# Patient Record
Sex: Female | Born: 1985 | Race: White | Hispanic: No | Marital: Married | Smoking: Never smoker
Health system: Southern US, Community
[De-identification: ages and names within clinical notes are randomized; demographics above are authoritative.]

## PROBLEM LIST (undated history)

## (undated) DIAGNOSIS — Z9109 Other allergy status, other than to drugs and biological substances: Secondary | ICD-10-CM

## (undated) DIAGNOSIS — E162 Hypoglycemia, unspecified: Secondary | ICD-10-CM

## (undated) DIAGNOSIS — R55 Syncope and collapse: Secondary | ICD-10-CM

## (undated) DIAGNOSIS — N946 Dysmenorrhea, unspecified: Secondary | ICD-10-CM

## (undated) DIAGNOSIS — N939 Abnormal uterine and vaginal bleeding, unspecified: Secondary | ICD-10-CM

## (undated) DIAGNOSIS — N920 Excessive and frequent menstruation with regular cycle: Secondary | ICD-10-CM

## (undated) HISTORY — DX: Excessive and frequent menstruation with regular cycle: N92.0

## (undated) HISTORY — DX: Syncope and collapse: R55

## (undated) HISTORY — PX: TUBAL LIGATION: SHX77

## (undated) HISTORY — DX: Abnormal uterine and vaginal bleeding, unspecified: N93.9

## (undated) HISTORY — PX: TONSILLECTOMY: SUR1361

## (undated) HISTORY — DX: Other allergy status, other than to drugs and biological substances: Z91.09

## (undated) HISTORY — DX: Hypoglycemia, unspecified: E16.2

## (undated) HISTORY — DX: Dysmenorrhea, unspecified: N94.6

---
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
## 2007-05-07 ENCOUNTER — Ambulatory Visit: Payer: Self-pay

## 2007-09-18 ENCOUNTER — Emergency Department: Payer: Self-pay | Admitting: Emergency Medicine

## 2008-06-06 ENCOUNTER — Emergency Department: Payer: Self-pay | Admitting: Emergency Medicine

## 2008-07-01 ENCOUNTER — Emergency Department: Payer: Self-pay | Admitting: Emergency Medicine

## 2008-08-29 ENCOUNTER — Ambulatory Visit: Payer: Self-pay | Admitting: Neurology

## 2008-09-30 ENCOUNTER — Observation Stay: Payer: Self-pay

## 2008-10-03 ENCOUNTER — Observation Stay: Payer: Self-pay | Admitting: Obstetrics and Gynecology

## 2008-11-14 ENCOUNTER — Observation Stay: Payer: Self-pay | Admitting: Obstetrics and Gynecology

## 2008-11-25 ENCOUNTER — Inpatient Hospital Stay: Payer: Self-pay

## 2008-11-25 ENCOUNTER — Observation Stay: Payer: Self-pay

## 2010-05-15 ENCOUNTER — Observation Stay: Payer: Self-pay | Admitting: Obstetrics and Gynecology

## 2010-07-17 ENCOUNTER — Inpatient Hospital Stay: Payer: Self-pay

## 2010-08-15 ENCOUNTER — Ambulatory Visit: Payer: Self-pay | Admitting: Obstetrics and Gynecology

## 2010-08-20 ENCOUNTER — Ambulatory Visit: Payer: Self-pay | Admitting: Obstetrics and Gynecology

## 2017-06-05 ENCOUNTER — Encounter: Payer: Self-pay | Admitting: Obstetrics and Gynecology

## 2017-06-05 ENCOUNTER — Ambulatory Visit (INDEPENDENT_AMBULATORY_CARE_PROVIDER_SITE_OTHER): Payer: 59 | Admitting: Obstetrics and Gynecology

## 2017-06-05 ENCOUNTER — Other Ambulatory Visit: Payer: Self-pay | Admitting: Obstetrics and Gynecology

## 2017-06-05 VITALS — BP 119/76 | HR 74 | Ht 63.0 in | Wt 158.2 lb

## 2017-06-05 DIAGNOSIS — N938 Other specified abnormal uterine and vaginal bleeding: Secondary | ICD-10-CM

## 2017-06-05 DIAGNOSIS — R102 Pelvic and perineal pain: Secondary | ICD-10-CM

## 2017-06-05 DIAGNOSIS — N946 Dysmenorrhea, unspecified: Secondary | ICD-10-CM | POA: Diagnosis not present

## 2017-06-05 NOTE — Progress Notes (Signed)
HPI:      [name] is a 32 y.o. [id number] who LMP was Patient's last menstrual period was 05/19/2017 (approximate).  Subjective:   She presents today with several complaints.  She states that she has had left-sided pelvic pain with her menstrual.  Ever since she had a tubal ligation.  She says the tubal ligation "did not go well and they had to cut time and burn".  She also states that she has had irregular cycles since that time.  She reports them as heavy lasting 7 days.  Some months she says she has 3 periods in the month. She reports that she has tried OCPs for 2 months but they did not help.  She says they made her moody but the pain continued.    Hx: The following portions of the patient's history were reviewed and updated as appropriate:             She  has a past medical history of Abnormal uterine bleeding, Environmental allergies, Heavy periods, and Painful menstrual periods. She does not have a problem list on file. She  has a past surgical history that includes Tubal ligation and Tonsillectomy. Her family history includes Colon cancer in her maternal uncle; Leukemia in her maternal grandmother; Skin cancer in her maternal grandfather. She  reports that she has never smoked. She has never used smokeless tobacco. She reports that she drank alcohol. She reports that she does not use drugs. She has a current medication list which includes the following prescription(s): cetirizine. She is allergic to azithromycin.       Review of Systems:  Review of Systems  Constitutional: Denied constitutional symptoms, night sweats, recent illness, fatigue, fever, insomnia and weight loss.  Eyes: Denied eye symptoms, eye pain, photophobia, vision change and visual disturbance.  Ears/Nose/Throat/Neck: Denied ear, nose, throat or neck symptoms, hearing loss, nasal discharge, sinus congestion and sore throat.  Cardiovascular: Denied cardiovascular symptoms, arrhythmia, chest  pain/pressure, edema, exercise intolerance, orthopnea and palpitations.  Respiratory: Denied pulmonary symptoms, asthma, pleuritic pain, productive sputum, cough, dyspnea and wheezing.  Gastrointestinal: Denied, gastro-esophageal reflux, melena, nausea and vomiting.  Genitourinary: See HPI for additional information.  Musculoskeletal: Denied musculoskeletal symptoms, stiffness, swelling, muscle weakness and myalgia.  Dermatologic: Denied dermatology symptoms, rash and scar.  Neurologic: Denied neurology symptoms, dizziness, headache, neck pain and syncope.  Psychiatric: Denied psychiatric symptoms, anxiety and depression.  Endocrine: Denied endocrine symptoms including hot flashes and night sweats.   Meds:   Current Outpatient Medications on File Prior to Visit  Medication Sig Dispense Refill  . cetirizine (ZYRTEC) 10 MG tablet Take by mouth.     No current facility-administered medications on file prior to visit.     Objective:     Vitals:   06/05/17 0815  BP: 119/76  Pulse: 74                Assessment:    G3P2012 There are no active problems to display for this patient.    1. Pelvic pain in female   2. Dysmenorrhea   3. DUB (dysfunctional uterine bleeding)     Possibilities were bleeding include fibroids, adenomyosis, endometriosis.  Her pain may be secondary to adhesions from her tubal ligation or simply 1 of the above.   Plan:            1.  Ultrasound to rule out obvious sources of pelvic pain.  If negative we have discussed IUD in detail and the patient has  agreed to IUD placement for cycle control and possibly pain control.  Should she fail IUD would strongly consider exploratory laparoscopy.  I discussed all this in detail with the patient.  Orders No orders of the defined types were placed in this encounter.   No orders of the defined types were placed in this encounter.     F/U  Return in about 2 weeks (around 06/19/2017) for We will contact her with any  abnormal test results, She is to call at the start of next menses. I spent 31 minutes with this patient of which greater than 50% was spent discussing her pelvic pain, irregular bleeding, workup, possible follow-ups and possible subsequent surgeries.  All her questions were addressed and answered.  Elonda Husky, M.D. 06/05/2017 8:53 AM

## 2017-06-10 ENCOUNTER — Ambulatory Visit (INDEPENDENT_AMBULATORY_CARE_PROVIDER_SITE_OTHER): Payer: 59

## 2017-06-10 DIAGNOSIS — N938 Other specified abnormal uterine and vaginal bleeding: Secondary | ICD-10-CM

## 2017-06-12 ENCOUNTER — Telehealth: Payer: Self-pay | Admitting: Obstetrics and Gynecology

## 2017-06-12 NOTE — Telephone Encounter (Signed)
LMTRC

## 2017-06-12 NOTE — Telephone Encounter (Signed)
The patient would like a call back to discuss her needing to have an IUD put in. Please advise.

## 2017-06-13 NOTE — Telephone Encounter (Signed)
Pt is to have iud placed on Tuesday. She started spotting 3 days ago and is expected to be on her cycle for the insertion. Pt aware that is fine. Pt did state that she had pain on her left side during the u/s. U/s report looks normal. Advised to discuss with DJE at visit.

## 2017-06-17 ENCOUNTER — Encounter: Payer: 59 | Admitting: Obstetrics and Gynecology

## 2017-06-24 ENCOUNTER — Encounter: Payer: 59 | Admitting: Obstetrics and Gynecology

## 2017-06-25 ENCOUNTER — Ambulatory Visit (INDEPENDENT_AMBULATORY_CARE_PROVIDER_SITE_OTHER): Payer: 59 | Admitting: Obstetrics and Gynecology

## 2017-06-25 ENCOUNTER — Encounter: Payer: Self-pay | Admitting: Obstetrics and Gynecology

## 2017-06-25 VITALS — BP 115/74 | HR 60 | Ht 63.0 in | Wt 155.0 lb

## 2017-06-25 DIAGNOSIS — R102 Pelvic and perineal pain: Secondary | ICD-10-CM

## 2017-06-25 DIAGNOSIS — Z30430 Encounter for insertion of intrauterine contraceptive device: Secondary | ICD-10-CM | POA: Diagnosis not present

## 2017-06-25 DIAGNOSIS — N946 Dysmenorrhea, unspecified: Secondary | ICD-10-CM | POA: Diagnosis not present

## 2017-06-25 DIAGNOSIS — N938 Other specified abnormal uterine and vaginal bleeding: Secondary | ICD-10-CM

## 2017-06-25 NOTE — Progress Notes (Signed)
HPI:      [name] is a 32 y.o. [id number] who LMP was Patient's last menstrual period was 06/11/2017 (exact date).  Subjective:   She presents today for IUD insertion.  She has been experiencing dysmenorrhea and menorrhagia.  Her ultrasound shows no significant pathology.    Hx: The following portions of the patient's history were reviewed and updated as appropriate:             She  has a past medical history of Abnormal uterine bleeding, Environmental allergies, Heavy periods, and Painful menstrual periods. She does not have a problem list on file. She  has a past surgical history that includes Tubal ligation and Tonsillectomy. Her family history includes Colon cancer in her maternal uncle; Leukemia in her maternal grandmother; Skin cancer in her maternal grandfather. She  reports that she has never smoked. She has never used smokeless tobacco. She reports that she drank alcohol. She reports that she does not use drugs. She has a current medication list which includes the following prescription(s): cetirizine. She is allergic to azithromycin.       Review of Systems:  Review of Systems  Constitutional: Denied constitutional symptoms, night sweats, recent illness, fatigue, fever, insomnia and weight loss.  Eyes: Denied eye symptoms, eye pain, photophobia, vision change and visual disturbance.  Ears/Nose/Throat/Neck: Denied ear, nose, throat or neck symptoms, hearing loss, nasal discharge, sinus congestion and sore throat.  Cardiovascular: Denied cardiovascular symptoms, arrhythmia, chest pain/pressure, edema, exercise intolerance, orthopnea and palpitations.  Respiratory: Denied pulmonary symptoms, asthma, pleuritic pain, productive sputum, cough, dyspnea and wheezing.  Gastrointestinal: Denied, gastro-esophageal reflux, melena, nausea and vomiting.  Genitourinary: See HPI for additional information.  Musculoskeletal: Denied musculoskeletal symptoms, stiffness, swelling,  muscle weakness and myalgia.  Dermatologic: Denied dermatology symptoms, rash and scar.  Neurologic: Denied neurology symptoms, dizziness, headache, neck pain and syncope.  Psychiatric: Denied psychiatric symptoms, anxiety and depression.  Endocrine: Denied endocrine symptoms including hot flashes and night sweats.   Meds:   Current Outpatient Medications on File Prior to Visit  Medication Sig Dispense Refill  . cetirizine (ZYRTEC) 10 MG tablet Take by mouth.     No current facility-administered medications on file prior to visit.     Objective:     Vitals:   06/25/17 1059  BP: 115/74  Pulse: 60     Ultrasound results reviewed directly with the patient.  Physical examination   Pelvic:   Vulva: Normal appearance.  No lesions.  Vagina: No lesions or abnormalities noted.  Support: Normal pelvic support.  Urethra No masses tenderness or scarring.  Meatus Normal size without lesions or prolapse.  Cervix: Normal appearance.  No lesions.  Anus: Normal exam.  No lesions.  Perineum: Normal exam.  No lesions.        Bimanual   Uterus: Normal size.  Non-tender.  Mobile.  AV.  Adnexae: No masses.  Non-tender to palpation.  Cul-de-sac: Negative for abnormality.   IUD Procedure Pt has read the booklet and signed the appropriate forms regarding the Mirena IUD.  All of her questions have been answered.   The cervix was cleansed with betadine solution.  After sounding the uterus and noting the position, the IUD was placed in the usual manner without problem.  The string was cut to the appropriate length.  The patient tolerated the procedure well.              Assessment:    G3P2012 There are no active problems to  display for this patient.    1. Pelvic pain in female   2. Dysmenorrhea   3. DUB (dysfunctional uterine bleeding)   4. Encounter for insertion of mirena IUD     Based on her history and findings it is likely she has adenomyosis.  She would like to have a trial of  IUD.  Plan:             F/U  Return in about 1 month (around 07/23/2017).  Elonda Husky, M.D. 06/25/2017 11:25 AM

## 2017-07-16 ENCOUNTER — Telehealth: Payer: Self-pay | Admitting: Obstetrics and Gynecology

## 2017-07-16 NOTE — Telephone Encounter (Signed)
The patient called and stated that she would to speak with a her nurse if possible in regards to her experiencing bleeding for two weeks after having her IUD placed. Please advise.

## 2017-07-16 NOTE — Telephone Encounter (Signed)
Pt is s/p mirena insertion on 5/8. She had spotting for several days after insertion. On 07/03/2017 she started her cycle. She states it is heavy with small clots. Changing q 4h. Pos for cramps. NO meds tried. NO uti sx. Regular bowel movements. NO n/v. Pt advised she may have aub for up to 6 months after insertion. Offered pt sooner appt but she states she will monitor for now. F/u at sting check or sooner for aub.

## 2017-07-28 ENCOUNTER — Encounter: Payer: 59 | Admitting: Obstetrics and Gynecology

## 2017-08-15 ENCOUNTER — Other Ambulatory Visit: Payer: Self-pay

## 2017-08-15 ENCOUNTER — Encounter: Payer: Self-pay | Admitting: Emergency Medicine

## 2017-08-15 ENCOUNTER — Ambulatory Visit
Admission: EM | Admit: 2017-08-15 | Discharge: 2017-08-15 | Disposition: A | Discharge status: Home / Self Care | Payer: 59 | Attending: Family Medicine | Admitting: Family Medicine

## 2017-08-15 DIAGNOSIS — R05 Cough: Secondary | ICD-10-CM | POA: Diagnosis not present

## 2017-08-15 DIAGNOSIS — R053 Chronic cough: Secondary | ICD-10-CM

## 2017-08-15 MED ORDER — BENZONATATE 100 MG PO CAPS: 100.0000 mg | ORAL_CAPSULE | Freq: Three times a day (TID) | ORAL | 0 refills | Status: DC | PRN

## 2017-08-15 MED ORDER — HYDROCOD POLST-CPM POLST ER 10-8 MG/5ML PO SUER: 5.0000 mL | Freq: Every evening | ORAL | 0 refills | Status: DC | PRN

## 2017-08-15 MED ORDER — ALBUTEROL SULFATE HFA 108 (90 BASE) MCG/ACT IN AERS: 1.0000 | INHALATION_SPRAY | Freq: Four times a day (QID) | RESPIRATORY_TRACT | 0 refills | Status: DC | PRN

## 2017-08-15 NOTE — Discharge Instructions (Signed)
Meds as prescribed.   If no improvement, I would recommend seeing Pulmonology.  Take care  Dr. Adriana Simasook

## 2017-08-15 NOTE — ED Provider Notes (Signed)
[facility]   CSN: [id number] Arrival date & time: 08/15/17  1648  History   Chief Complaint Chief Complaint  Patient presents with  . Cough   HPI  32 year old female presents with cough.  Cough  Patient reports that she has had a dry cough for 5 weeks.  She states that it started after she moved into her father-in-law's house to care for him while he was going through cancer treatment.  Father-in-law has now passed away and they are still living in the house.  He was a smoker.  Patient reports that she feels well she is just bothered by the cough.  It is not productive.  She has had no fever.  She is taking zyrtec without relief.  Exacerbated by prolonged talking, being outside.  Likely exacerbated by moving to this house.  No relieving factors.  No associated shortness of breath.  No other associated symptoms.  No other complaints.  Past Medical History:  Diagnosis Date  . Abnormal uterine bleeding   . Environmental allergies   . Heavy periods   . Painful menstrual periods    Past Surgical History:  Procedure Laterality Date  . TONSILLECTOMY    . TUBAL LIGATION     OB History    Gravida  3   Para  2   Term  2   Preterm      AB  1   Living  2     SAB  1   TAB      Ectopic      Multiple      Live Births  2          Home Medications    Prior to Admission medications   Medication Sig Start Date End Date Taking? Authorizing Provider  cetirizine (ZYRTEC) 10 MG tablet Take by mouth.   Yes [provider]  levonorgestrel (MIRENA) 20 MCG/24HR IUD 1 each by Intrauterine route once.   Yes [provider]  albuterol (PROVENTIL HFA;VENTOLIN HFA) 108 (90 Base) MCG/ACT inhaler Inhale 1-2 puffs into the lungs every 6 (six) hours as needed for wheezing or shortness of breath. 08/15/17   Tommie Sams, DO  benzonatate (TESSALON) 100 MG capsule Take 1 capsule (100 mg total) by mouth 3 (three) times daily as needed. 08/15/17    Tommie Sams, DO  chlorpheniramine-HYDROcodone (TUSSIONEX PENNKINETIC ER) 10-8 MG/5ML SUER Take 5 mLs by mouth at bedtime as needed. 08/15/17   Tommie Sams, DO   Family History Family History  Problem Relation Age of Onset  . Colon cancer Maternal Uncle   . Leukemia Maternal Grandmother   . Skin cancer Maternal Grandfather   . Breast cancer Neg Hx   . Ovarian cancer Neg Hx   . Diabetes Neg Hx    Social History Social History   Tobacco Use  . Smoking status: Never Smoker  . Smokeless tobacco: Never Used  Substance Use Topics  . Alcohol use: Not Currently  . Drug use: Never   Allergies   Azithromycin  Review of Systems Review of Systems  Constitutional: Negative.   Respiratory: Positive for cough.    Physical Exam Triage Vital Signs ED Triage Vitals  Enc Vitals Group     BP 08/15/17 1703 121/79     Pulse Rate 08/15/17 1703 65     Resp 08/15/17 1703 14     Temp 08/15/17 1703 98.4 F (36.9 C)     Temp Source 08/15/17 1703 Oral  SpO2 08/15/17 1703 100 %     Weight 08/15/17 1700 150 lb (68 kg)     Height 08/15/17 1700 5\' 3"  (1.6 m)     Head Circumference --      Peak Flow --      Pain Score 08/15/17 1700 0     Pain Loc --      Pain Edu? --      Excl. in GC? --    Updated Vital Signs BP 121/79 (BP Location: Left Arm)   Pulse 65   Temp 98.4 F (36.9 C) (Oral)   Resp 14   Ht 5\' 3"  (1.6 m)   Wt 150 lb (68 kg)   LMP 08/01/2017 (Approximate)   SpO2 100%   BMI 26.57 kg/m   Physical Exam  Constitutional: She is oriented to person, place, and time. She appears well-developed. No distress.  HENT:  Head: Normocephalic and atraumatic.  Mouth/Throat: Oropharynx is clear and moist.  Cardiovascular: Normal rate and regular rhythm.  Pulmonary/Chest: Effort normal and breath sounds normal. She has no wheezes. She has no rales.  Neurological: She is alert and oriented to person, place, and time.  Psychiatric: She has a normal mood and affect. Her behavior is  normal.  Nursing note and vitals reviewed.  UC Treatments / Results  Labs (all labs ordered are listed, but only abnormal results are displayed) Labs Reviewed - No data to display  EKG None  Radiology No results found.  Procedures Procedures (including critical care time)  Medications Ordered in UC Medications - No data to display  Initial Impression / Assessment and Plan / UC Course  I have reviewed the triage vital signs and the nursing notes.  Pertinent labs & imaging results that were available during my care of the patient were reviewed by me and considered in my medical decision making (see chart for details).    32 year old female presents with chronic cough.  Denies postnasal drip, GERD.  Does endorse a history of asthma as a child.  Treating with Tessalon Perles, Tussionex, albuterol.  If fails to improve or worsens, will need to see pulmonology for PFT's.  Final Clinical Impressions(s) / UC Diagnoses   Final diagnoses:  Chronic cough     Discharge Instructions     Meds as prescribed.   If no improvement, I would recommend seeing Pulmonology.  Take care  Dr. Adriana Simasook     ED Prescriptions    Medication Sig Dispense Auth. Provider   benzonatate (TESSALON) 100 MG capsule Take 1 capsule (100 mg total) by mouth 3 (three) times daily as needed. 30 capsule [place], [place] G, DO   chlorpheniramine-HYDROcodone (TUSSIONEX PENNKINETIC ER) 10-8 MG/5ML SUER Take 5 mLs by mouth at bedtime as needed. 60 mL Bria Sparr G, DO   albuterol (PROVENTIL HFA;VENTOLIN HFA) 108 (90 Base) MCG/ACT inhaler Inhale 1-2 puffs into the lungs every 6 (six) hours as needed for wheezing or shortness of breath. 1 Inhaler Tommie Samsook, Katira Dumais G, DO     Controlled Substance Prescriptions [place] Controlled Substance Registry consulted? Not Applicable   Tommie SamsCook, Jamichael Knotts G, DO 08/15/17 1732

## 2017-08-15 NOTE — ED Triage Notes (Signed)
Patient c/o cough and chest congestion for over a month.  Patient denies fevers.

## 2018-03-31 ENCOUNTER — Ambulatory Visit
Admission: EM | Admit: 2018-03-31 | Discharge: 2018-03-31 | Disposition: A | Discharge status: Home / Self Care | Payer: 59 | Attending: Family Medicine | Admitting: Family Medicine

## 2018-03-31 ENCOUNTER — Other Ambulatory Visit: Payer: Self-pay

## 2018-03-31 DIAGNOSIS — J209 Acute bronchitis, unspecified: Secondary | ICD-10-CM

## 2018-03-31 DIAGNOSIS — J9801 Acute bronchospasm: Secondary | ICD-10-CM

## 2018-03-31 MED ORDER — PREDNISONE 10 MG PO TABS: ORAL_TABLET | ORAL | 0 refills | Status: DC

## 2018-03-31 MED ORDER — DOXYCYCLINE HYCLATE 100 MG PO CAPS: 100.0000 mg | ORAL_CAPSULE | Freq: Two times a day (BID) | ORAL | 0 refills | Status: DC

## 2018-03-31 MED ORDER — BENZONATATE 100 MG PO CAPS: 100.0000 mg | ORAL_CAPSULE | Freq: Three times a day (TID) | ORAL | 0 refills | Status: DC | PRN

## 2018-03-31 MED ORDER — IPRATROPIUM-ALBUTEROL 0.5-2.5 (3) MG/3ML IN SOLN
3.0000 mL | Freq: Once | RESPIRATORY_TRACT | Status: AC
  Administered 2018-03-31: 3 mL via RESPIRATORY_TRACT

## 2018-03-31 MED ORDER — HYDROCOD POLST-CPM POLST ER 10-8 MG/5ML PO SUER: 5.0000 mL | Freq: Every evening | ORAL | 0 refills | Status: DC | PRN

## 2018-03-31 NOTE — ED Provider Notes (Signed)
[facility] ____________________________________________  Time seen: Approximately 2:02 PM  I have reviewed the triage vital signs and the nursing notes.   HISTORY  Chief Complaint Cough   HPI Teresa Sutton is a 33 y.o. female for evaluation of cough present for approximately 2 weeks.  States initially symptoms were more cold-like, with accompanying high fevers.  States her husband was sick with similar at the time.  States all symptoms have resolved including fevers except for continued cough.  States cough is frustrating.  Disrupt sleep.  States cough is occasionally productive, but mostly dry.  Intermittent wheezing.  History of asthma.  Unresolved with over-the-counter cough and congestion medications and home albuterol inhaler.  States some shortness of breath, she states she is trying to guard her breath.  States laughing or deep breathing triggers a cough.  Denies chest pain, but does state sore from coughing.  Overall continues to eat and drink well.  Denies current sore throat.  Denies recent sickness.  No LMP recorded. (Menstrual status: IUD).    Past Medical History:  Diagnosis Date  . Abnormal uterine bleeding   . Environmental allergies   . Heavy periods   . Painful menstrual periods     There are no active problems to display for this patient.   Past Surgical History:  Procedure Laterality Date  . TONSILLECTOMY    . TUBAL LIGATION       No current facility-administered medications for this encounter.   Current Outpatient Medications:  .  albuterol (PROVENTIL HFA;VENTOLIN HFA) 108 (90 Base) MCG/ACT inhaler, Inhale 1-2 puffs into the lungs every 6 (six) hours as needed for wheezing or shortness of breath., Disp: 1 Inhaler, Rfl: 0 .  cetirizine (ZYRTEC) 10 MG tablet, Take by mouth., Disp: , Rfl:  .  levonorgestrel (MIRENA) 20 MCG/24HR IUD, 1 each by Intrauterine route once., Disp: , Rfl:  .  benzonatate (TESSALON PERLES) 100 MG capsule, Take  1 capsule (100 mg total) by mouth 3 (three) times daily as needed for cough., Disp: 15 capsule, Rfl: 0 .  chlorpheniramine-HYDROcodone (TUSSIONEX PENNKINETIC ER) 10-8 MG/5ML SUER, Take 5 mLs by mouth at bedtime as needed for cough. do not drive or operate machinery while taking as can cause drowsiness., Disp: 50 mL, Rfl: 0 .  doxycycline (VIBRAMYCIN) 100 MG capsule, Take 1 capsule (100 mg total) by mouth 2 (two) times daily., Disp: 20 capsule, Rfl: 0 .  predniSONE (DELTASONE) 10 MG tablet, Start 60 mg po day one, then 50 mg po day two, taper by 10 mg daily until complete., Disp: 21 tablet, Rfl: 0  Allergies Azithromycin  Family History  Problem Relation Age of Onset  . Colon cancer Maternal Uncle   . Leukemia Maternal Grandmother   . Skin cancer Maternal Grandfather   . Breast cancer Neg Hx   . Ovarian cancer Neg Hx   . Diabetes Neg Hx     Social History Social History   Tobacco Use  . Smoking status: Never Smoker  . Smokeless tobacco: Never Used  Substance Use Topics  . Alcohol use: Not Currently  . Drug use: Never    Review of Systems Constitutional: Fever in few days. ENT: No sore throat.  As above Cardiovascular: Denies chest pain. Respiratory: Denies shortness of breath. Gastrointestinal: No abdominal pain.   Musculoskeletal: Negative for back pain. Skin: Negative for rash.   ____________________________________________   PHYSICAL EXAM:  VITAL SIGNS: ED Triage Vitals  Enc Vitals Group     BP 03/31/18  1307 115/81     Pulse Rate 03/31/18 1307 94     Resp 03/31/18 1307 (!) 21     Temp 03/31/18 1307 98.3 F (36.8 C)     Temp Source 03/31/18 1307 Oral     SpO2 03/31/18 1307 97 %     Weight 03/31/18 1305 140 lb (63.5 kg)     Height 03/31/18 1305 5\' 3"  (1.6 m)     Head Circumference --      Peak Flow --      Pain Score 03/31/18 1305 0     Pain Loc --      Pain Edu? --      Excl. in GC? --     Constitutional: Alert and oriented. Well appearing and in no  acute distress. Eyes: Conjunctivae are normal.  Head: Atraumatic. No sinus tenderness to palpation. No swelling. No erythema.  Ears: no erythema, normal TMs bilaterally.   Nose:mild nasal congestion   Mouth/Throat: Mucous membranes are moist. No pharyngeal erythema. No tonsillar swelling or exudate.  Neck: No stridor.  No cervical spine tenderness to palpation. Hematological/Lymphatic/Immunilogical: No cervical lymphadenopathy. Cardiovascular: Normal rate, regular rhythm. Grossly normal heart sounds.  Good peripheral circulation. Respiratory: Normal respiratory effort.  No retractions. No wheezes, rales or rhonchi. Good air movement.  Dry intermittent cough with bronchospasm.  Speaks in complete sentences. Gastrointestinal: Soft and nontender.  Musculoskeletal: Ambulatory with steady gait.  No lower extremity edema noted bilaterally. Neurologic:  Normal speech and language. No gait instability. Skin:  Skin appears warm, dry and intact. No rash noted. Psychiatric: Mood and affect are normal. Speech and behavior are normal. ___________________________________________   LABS (all labs ordered are listed, but only abnormal results are displayed)  Labs Reviewed - No data to display ____________________________________________  PROCEDURES Procedures    INITIAL IMPRESSION / ASSESSMENT AND PLAN / ED COURSE  Pertinent labs & imaging results that were available during my care of the patient were reviewed by me and considered in my medical decision making (see chart for details).  Overall well-appearing patient.  No acute distress.  Suspect recent viral illness such as influenza, continued with cough.  Bronchitis with bronchospasm.  Will treat with oral doxycycline, prednisone taper, continue home albuterol inhaler, PRN Tessalon Perles and PRN Tessalon Perles.  Supportive care.  Discussed evaluation chest x-ray this time, patient declined.  Discussed follow-up and return parameters.Discussed  indication, risks and benefits of medications with patient.  DuoNeb given once in urgent care.  Discussed follow up with Primary care physician this week. Discussed follow up and return parameters including no resolution or any worsening concerns. Patient verbalized understanding and agreed to plan.   ____________________________________________   FINAL CLINICAL IMPRESSION(S) / ED DIAGNOSES  Final diagnoses:  Acute bronchitis, unspecified organism  Bronchospasm     ED Discharge Orders         Ordered    chlorpheniramine-HYDROcodone (TUSSIONEX PENNKINETIC ER) 10-8 MG/5ML SUER  At bedtime PRN     03/31/18 1349    benzonatate (TESSALON PERLES) 100 MG capsule  3 times daily PRN     03/31/18 1349    predniSONE (DELTASONE) 10 MG tablet     03/31/18 1349    doxycycline (VIBRAMYCIN) 100 MG capsule  2 times daily     03/31/18 1349           Note: This dictation was prepared with Dragon dictation along with smaller phrase technology. Any transcriptional errors that result from this process are unintentional.  Renford Dills, NP 03/31/18 1406

## 2018-03-31 NOTE — ED Triage Notes (Signed)
Patient states that around 2 weeks ago she had a flu like illness for 5 days. Pateint states that cough has continued to linger but cough has worsened. States that she has been having shortness of breath with the cough.

## 2018-03-31 NOTE — Discharge Instructions (Addendum)
Take medication as prescribed. Rest. Drink plenty of fluids. Continue albuterol inhaler as needed.  Follow up with your primary care physician this week as needed. Return to Urgent care for new or worsening concerns.

## 2018-04-13 ENCOUNTER — Encounter: Payer: Self-pay | Admitting: Emergency Medicine

## 2018-04-13 ENCOUNTER — Ambulatory Visit
Admission: EM | Admit: 2018-04-13 | Discharge: 2018-04-13 | Disposition: A | Discharge status: Home / Self Care | Payer: Self-pay | Attending: Family Medicine | Admitting: Family Medicine

## 2018-04-13 ENCOUNTER — Ambulatory Visit (INDEPENDENT_AMBULATORY_CARE_PROVIDER_SITE_OTHER): Payer: Self-pay

## 2018-04-13 ENCOUNTER — Other Ambulatory Visit: Payer: Self-pay

## 2018-04-13 DIAGNOSIS — R0781 Pleurodynia: Secondary | ICD-10-CM

## 2018-04-13 DIAGNOSIS — J45901 Unspecified asthma with (acute) exacerbation: Secondary | ICD-10-CM

## 2018-04-13 MED ORDER — HYDROCOD POLST-CPM POLST ER 10-8 MG/5ML PO SUER: 5.0000 mL | Freq: Every evening | ORAL | 0 refills | Status: DC | PRN

## 2018-04-13 MED ORDER — ALBUTEROL SULFATE HFA 108 (90 BASE) MCG/ACT IN AERS: 2.0000 | INHALATION_SPRAY | RESPIRATORY_TRACT | 0 refills | Status: DC | PRN

## 2018-04-13 MED ORDER — PREDNISONE 10 MG PO TABS: ORAL_TABLET | ORAL | 0 refills | Status: DC

## 2018-04-13 NOTE — ED Provider Notes (Signed)
[facility] ____________________________________________  Time seen: Approximately 2:21 PM  I have reviewed the triage vital signs and the nursing notes.   HISTORY  Chief Complaint Muscle Pain   HPI Teresa Sutton is a 33 y.o. female presenting for evaluation of continued cough complaints.  Patient has a history of asthma and has had recent cough lasting for several weeks.  Was seen in urgent care on 2/11 for same complaints, at that point was treated for asthmatic bronchitis with doxycycline, prednisone, continuing albuterol and cough medication.  Patient states that these medicines much improved her symptoms and has been doing better.  States she does still have cough fits, particularly with deep breath, fussing with her kids and laughing.  States she had 1 of these last night and led to her coughing very hard at which point she felt a pop on her right rib with associated pain.  States she continues with pain to the same area, currently mild.  States she wanted to make sure she did not break a rib.  States occasional wheezing.  Again states cough has been much better.  Cough is dry nonproductive.  Denies hemoptysis.  No recent fevers.  Denies chest pain or shortness of breath.  Denies pregnancy.   Past Medical History:  Diagnosis Date  . Abnormal uterine bleeding   . Environmental allergies   . Heavy periods   . Painful menstrual periods     There are no active problems to display for this patient.   Past Surgical History:  Procedure Laterality Date  . TONSILLECTOMY    . TUBAL LIGATION       No current facility-administered medications for this encounter.   Current Outpatient Medications:  .  albuterol (PROVENTIL HFA;VENTOLIN HFA) 108 (90 Base) MCG/ACT inhaler, Inhale 1-2 puffs into the lungs every 6 (six) hours as needed for wheezing or shortness of breath., Disp: 1 Inhaler, Rfl: 0 .  levonorgestrel (MIRENA) 20 MCG/24HR IUD, 1 each by Intrauterine route  once., Disp: , Rfl:  .  albuterol (PROVENTIL HFA;VENTOLIN HFA) 108 (90 Base) MCG/ACT inhaler, Inhale 2 puffs into the lungs every 4 (four) hours as needed., Disp: 1 Inhaler, Rfl: 0 .  chlorpheniramine-HYDROcodone (TUSSIONEX PENNKINETIC ER) 10-8 MG/5ML SUER, Take 5 mLs by mouth at bedtime as needed for cough. do not drive or operate machinery while taking as can cause drowsiness., Disp: 50 mL, Rfl: 0 .  predniSONE (DELTASONE) 10 MG tablet, Start 60 mg po day one, then 50 mg po day two, taper by 10 mg daily until complete., Disp: 21 tablet, Rfl: 0  Allergies Azithromycin  Family History  Problem Relation Age of Onset  . Colon cancer Maternal Uncle   . Leukemia Maternal Grandmother   . Skin cancer Maternal Grandfather   . Breast cancer Neg Hx   . Ovarian cancer Neg Hx   . Diabetes Neg Hx     Social History Social History   Tobacco Use  . Smoking status: Never Smoker  . Smokeless tobacco: Never Used  Substance Use Topics  . Alcohol use: Not Currently  . Drug use: Never    Review of Systems Constitutional: No fever. ENT: No sore throat.  Cardiovascular: Denies chest pain. Respiratory: Denies shortness of breath. Gastrointestinal: No abdominal pain.   Musculoskeletal: Negative for back pain. Skin: Negative for rash.   ____________________________________________   PHYSICAL EXAM:  VITAL SIGNS: ED Triage Vitals  Enc Vitals Group     BP 04/13/18 1032 107/81     Pulse  Rate 04/13/18 1032 85     Resp 04/13/18 1032 18     Temp 04/13/18 1032 98.3 F (36.8 C)     Temp Source 04/13/18 1032 Oral     SpO2 04/13/18 1032 100 %     Weight 04/13/18 1030 140 lb (63.5 kg)     Height 04/13/18 1030  (1.6 m)     Head Circumference --      Peak Flow --      Pain Score 04/13/18 1030 6     Pain Loc --      Pain Edu? --      Excl. in GC? --     Constitutional: Alert and oriented. Well appearing and in no acute distress. Eyes: Conjunctivae are normal.  Head: Atraumatic. No  sinus tenderness to palpation. No swelling. No erythema.  Ears: no erythema, normal TMs bilaterally.   Nose:Nasal congestion with clear rhinorrhea  Mouth/Throat: Mucous membranes are moist. Mild pharyngeal erythema. No tonsillar swelling or exudate.  Neck: No stridor.  No cervical spine tenderness to palpation. Hematological/Lymphatic/Immunilogical: No cervical lymphadenopathy. Cardiovascular: Normal rate, regular rhythm. Grossly normal heart sounds.  Good peripheral circulation. Respiratory: Normal respiratory effort.  No retractions. No wheezes, rales or rhonchi. Good air movement.  Occasional dry cough noted with mild bronchospasm.  Speaks in complete sentences. Gastrointestinal: Soft and nontender.  Musculoskeletal: Ambulatory with steady gait.  Right lateral to anterior along 7 through 9 rib direct tenderness to palpation, no ecchymosis, no rash. Neurologic:  Normal speech and language. No gait instability. Skin:  Skin appears warm, dry and intact. No rash noted. Psychiatric: Mood and affect are normal. Speech and behavior are normal. ___________________________________________   LABS (all labs ordered are listed, but only abnormal results are displayed)  Labs Reviewed - No data to display ____________________________________________  RADIOLOGY  Dg Ribs Unilateral W/chest Right  Result Date: 04/13/2018 CLINICAL DATA:  Dry cough and flu like symptoms for the past 2-3 weeks. Right-sided rib pain. EXAM: RIGHT RIBS AND CHEST - 3+ VIEW COMPARISON:  None. FINDINGS: No fracture or other bone lesions are seen involving the ribs. There is no evidence of pneumothorax or pleural effusion. Both lungs are clear. Heart size and mediastinal contours are within normal limits. IMPRESSION: Negative. Electronically Signed   By: Obie Dredge M.D.   On: 04/13/2018 11:49   ____________________________________________   PROCEDURES Procedures    INITIAL IMPRESSION / ASSESSMENT AND PLAN / ED  COURSE  Pertinent labs & imaging results that were available during my care of the patient were reviewed by me and considered in my medical decision making (see chart for details).  Well-appearing patient.  No acute distress.  Patient reports improved since last visit.  Does continue with intermittent cough, mild bronchospasm noted.  Rib and chest as above per radiologist, negative.  Will treat with patient with prn tussionex, prednisone and albuterol.  [place] controlled substance database reviewed, 2/11 cough syrup most recent cough syrup are controlled noted, no other conflict noted.  Discussed follow up with Primary care physician this week. Discussed follow up and return parameters including no resolution or any worsening concerns. Patient verbalized understanding and agreed to plan.   ____________________________________________   FINAL CLINICAL IMPRESSION(S) / ED DIAGNOSES  Final diagnoses:  Asthmatic bronchitis with acute exacerbation, unspecified asthma severity, unspecified whether persistent  Rib pain on right side     ED Discharge Orders         Ordered    albuterol (PROVENTIL HFA;VENTOLIN HFA)  108 (90 Base) MCG/ACT inhaler  Every 4 hours PRN     04/13/18 1210    predniSONE (DELTASONE) 10 MG tablet     04/13/18 1210    chlorpheniramine-HYDROcodone (TUSSIONEX PENNKINETIC ER) 10-8 MG/5ML SUER  At bedtime PRN     04/13/18 1210           Note: This dictation was prepared with Dragon dictation along with smaller phrase technology. Any transcriptional errors that result from this process are unintentional.         Renford Dills, NP 04/13/18 1429

## 2018-04-13 NOTE — Discharge Instructions (Signed)
Take medication as prescribed. Rest. Drink plenty of fluids. Deep breaths.   Follow up with your primary care physician this week as needed. Return to Urgent care for new or worsening concerns.

## 2018-04-13 NOTE — ED Triage Notes (Signed)
Patient states she was seen 2 weeks ago for flu like illness and states she has continued to have the cough. She states she has coughed so much that she felt a "pop" on the right side of her ribs. She is concerned that she may have cracked her rib.

## 2019-06-24 IMAGING — CR DG RIBS W/ CHEST 3+V*R*
5 series · 6 of 6 positions shown · non-contrast
Comparison: None.

CLINICAL DATA: Dry cough and flu like symptoms for the past 2-3
weeks. Right-sided rib pain.

EXAM:
RIGHT RIBS AND CHEST - 3+ VIEW

[Series 1: chest pa · 0.14mm/px · 2 of 2 slices shown]
[im 1/2]
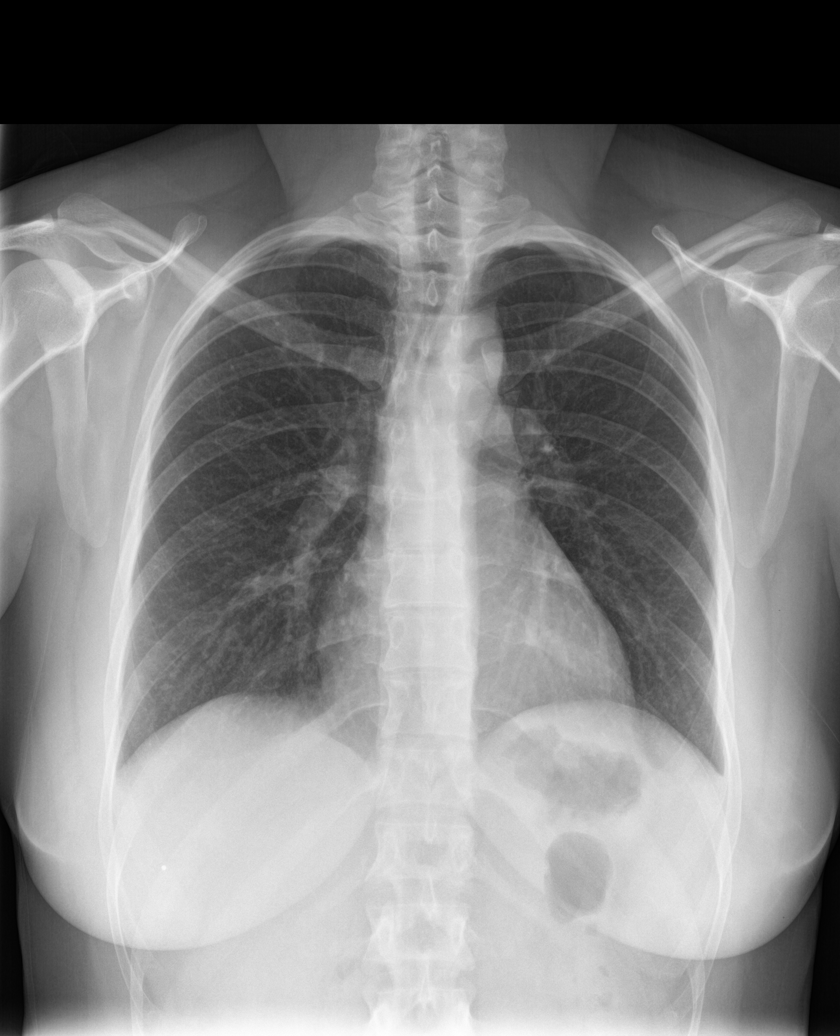
[im 2/2]
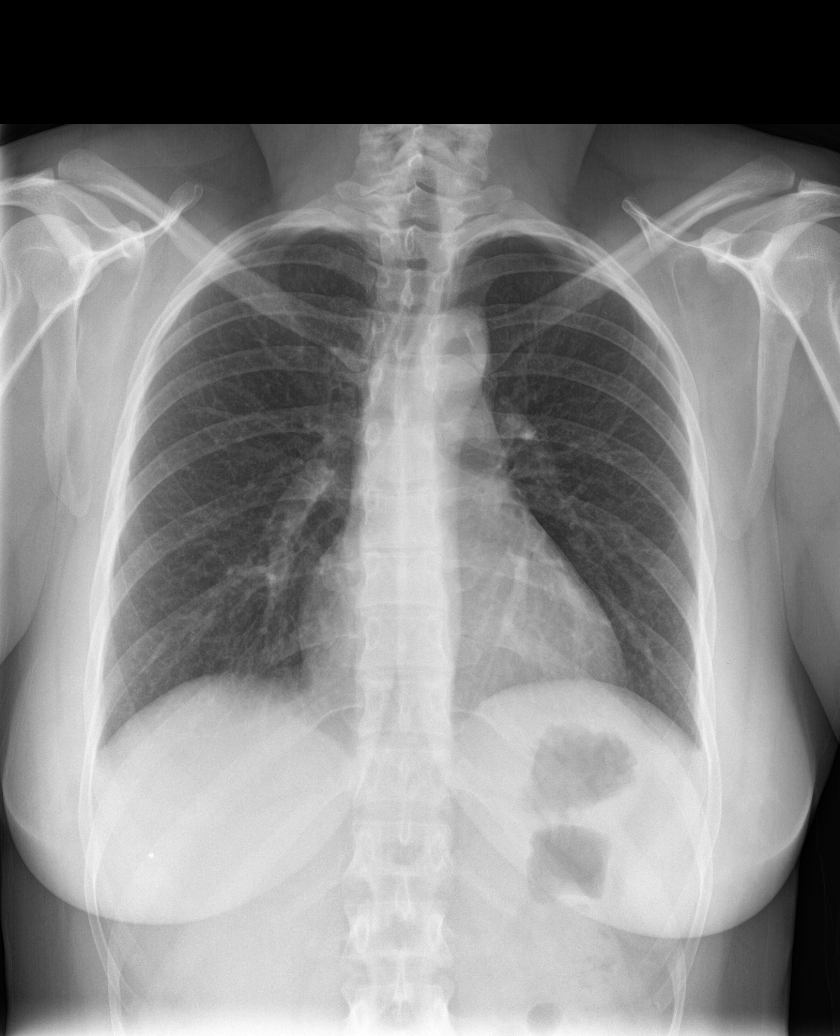

[rib pa (1 of 2)]
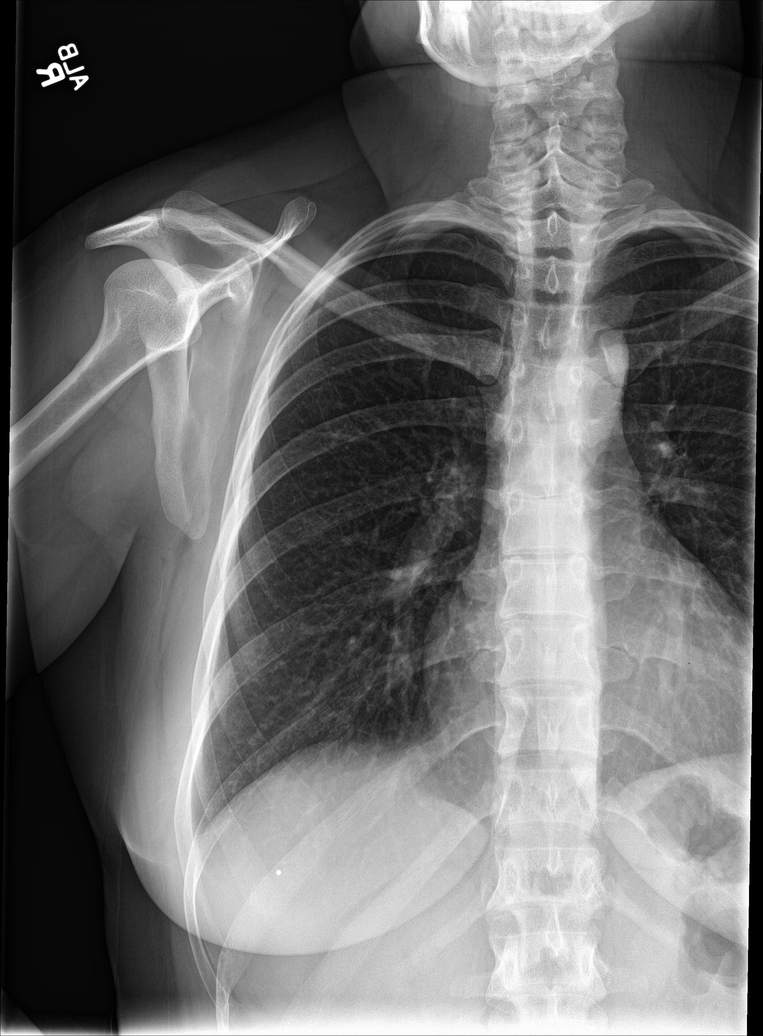

[rib pa (2 of 2)]
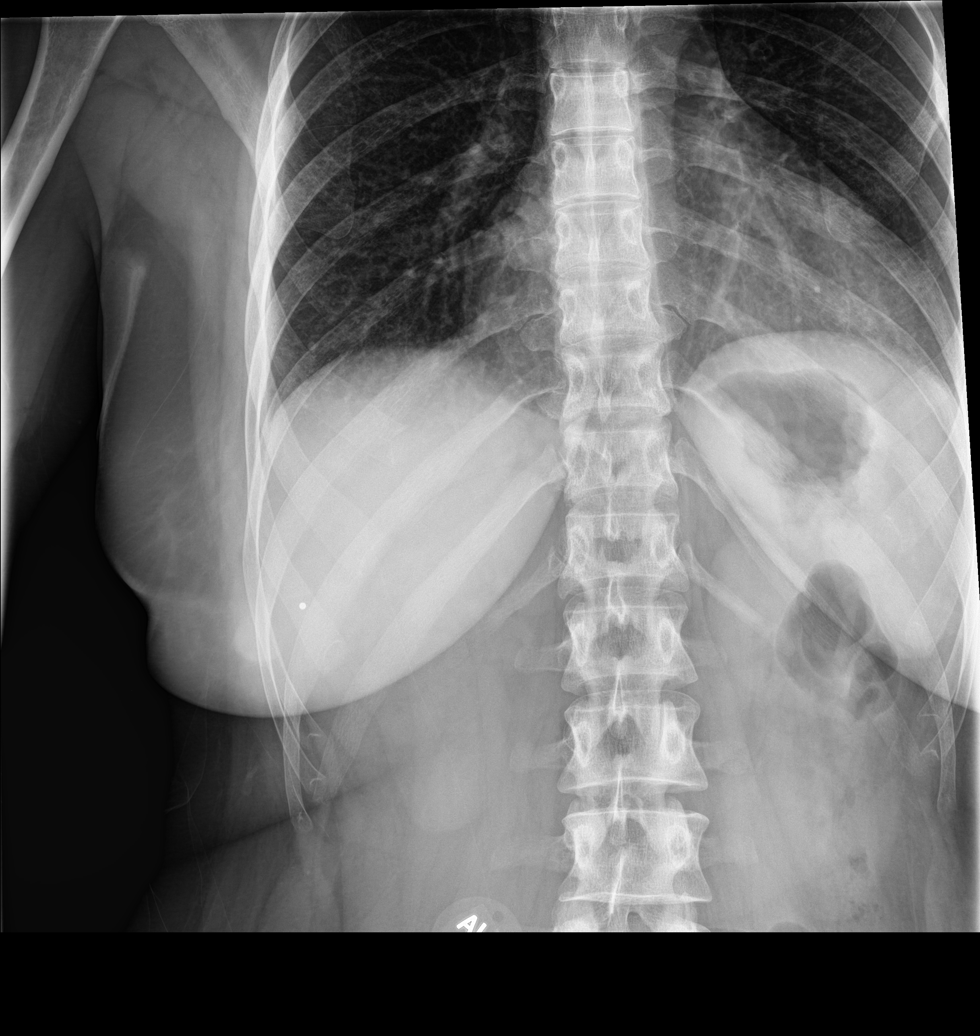

[rib obl (1 of 2)]
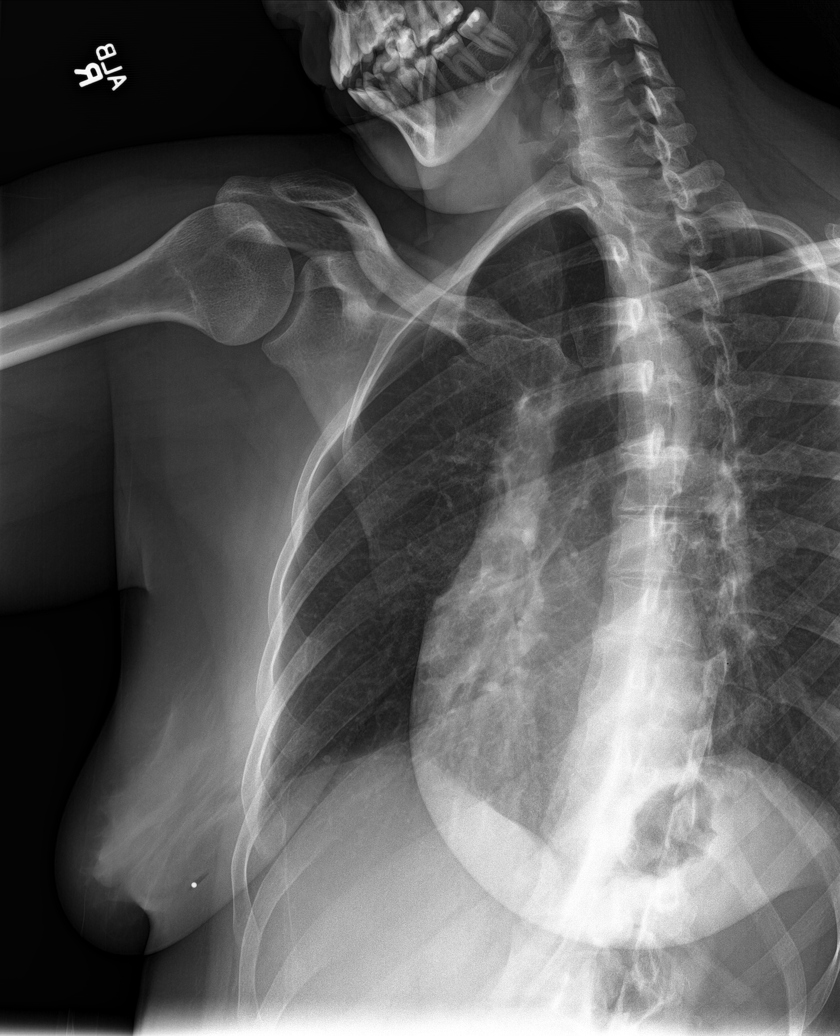

[rib obl (2 of 2)]
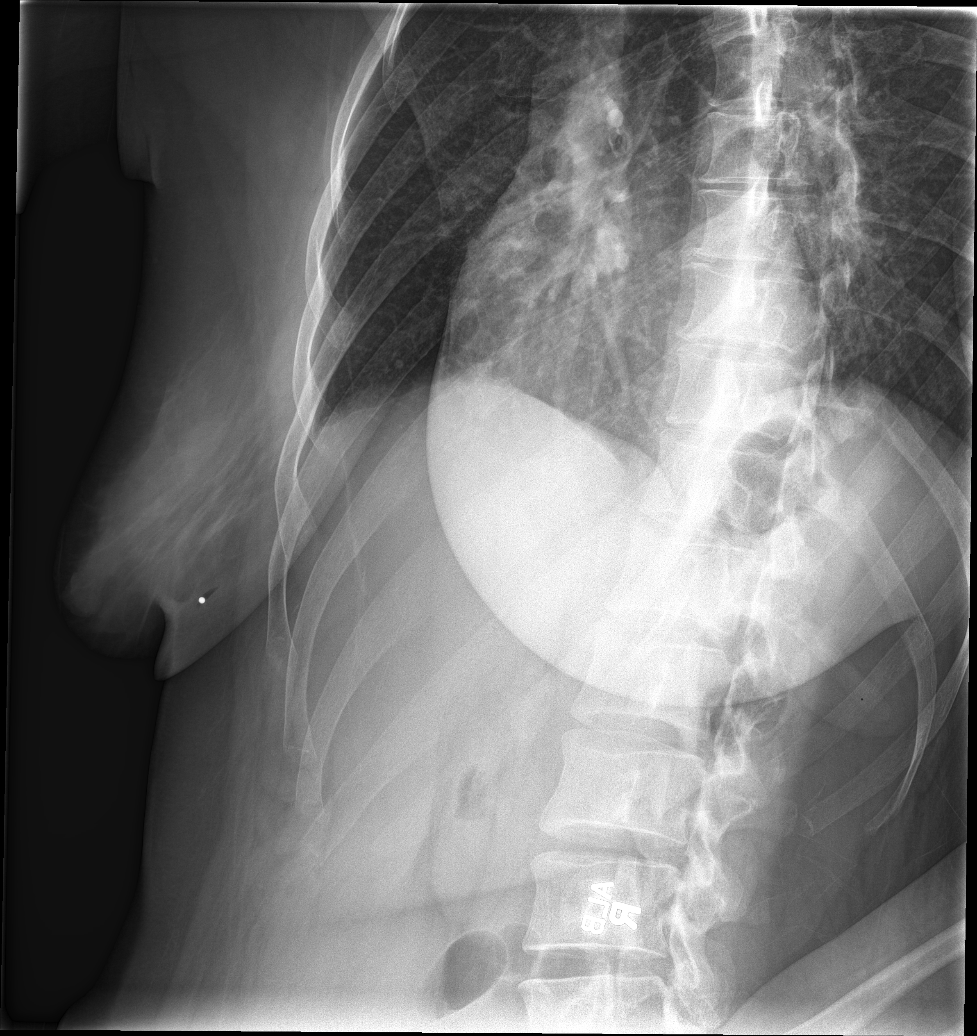

[6 of 6 positions shown; findings below may reference images not displayed]

FINDINGS: No fracture or other bone lesions are seen involving the ribs. There
is no evidence of pneumothorax or pleural effusion. Both lungs are
clear. Heart size and mediastinal contours are within normal limits.
IMPRESSION: Negative.

## 2022-01-24 ENCOUNTER — Ambulatory Visit (INDEPENDENT_AMBULATORY_CARE_PROVIDER_SITE_OTHER): Payer: Self-pay

## 2022-01-24 ENCOUNTER — Ambulatory Visit
Admission: EM | Admit: 2022-01-24 | Discharge: 2022-01-24 | Disposition: A | Discharge status: Home / Self Care | Payer: Self-pay | Attending: Internal Medicine | Admitting: Internal Medicine

## 2022-01-24 DIAGNOSIS — R0602 Shortness of breath: Secondary | ICD-10-CM

## 2022-01-24 DIAGNOSIS — J101 Influenza due to other identified influenza virus with other respiratory manifestations: Secondary | ICD-10-CM

## 2022-01-24 DIAGNOSIS — U071 COVID-19: Secondary | ICD-10-CM

## 2022-01-24 LAB — RESP PANEL BY RT-PCR (FLU A&B, COVID) ARPGX2
Influenza A by PCR: NEGATIVE
Influenza B by PCR: POSITIVE — AB
SARS Coronavirus 2 by RT PCR: POSITIVE — AB

## 2022-01-24 MED ORDER — OSELTAMIVIR PHOSPHATE 75 MG PO CAPS: 75.0000 mg | ORAL_CAPSULE | Freq: Two times a day (BID) | ORAL | 0 refills | Status: DC

## 2022-01-24 MED ORDER — IPRATROPIUM-ALBUTEROL 0.5-2.5 (3) MG/3ML IN SOLN
3.0000 mL | Freq: Once | RESPIRATORY_TRACT | Status: AC
  Administered 2022-01-24: 3 mL via RESPIRATORY_TRACT

## 2022-01-24 MED ORDER — MOLNUPIRAVIR EUA 200MG CAPSULE: 4.0000 | ORAL_CAPSULE | Freq: Two times a day (BID) | ORAL | 0 refills | Status: AC

## 2022-01-24 NOTE — ED Provider Notes (Signed)
[facility]    CSN: [id number] Arrival date & time: 01/24/22  1520      History   Chief Complaint Chief Complaint  Patient presents with   Nasal Congestion   Cough    HPI Teresa Sutton is a 36 y.o. female who presents with onset of post nasal drainage and cough 3 days ago. She feels like when she gets covid and has had it x 3. She has been feeling easily SOB with activity and she checked her pulse ox with walking and went down to 92%. She has hx of asthma and since having covid, has been worse. She never did pulmonary rehab, since she does not have insurance.     Past Medical History:  Diagnosis Date   Abnormal uterine bleeding    Environmental allergies    Heavy periods    Painful menstrual periods     There are no problems to display for this patient.   Past Surgical History:  Procedure Laterality Date   TONSILLECTOMY     TUBAL LIGATION      OB History     Gravida  3   Para  2   Term  2   Preterm      AB  1   Living  2      SAB  1   IAB      Ectopic      Multiple      Live Births  2            Home Medications    Prior to Admission medications   Medication Sig Start Date End Date Taking? Authorizing Provider  molnupiravir EUA (LAGEVRIO) 200 mg CAPS capsule Take 4 capsules (800 mg total) by mouth 2 (two) times daily for 5 days. 01/24/22 01/29/22 Yes Rodriguez-Southworth, Nettie Elm, PA-C  oseltamivir (TAMIFLU) 75 MG capsule Take 1 capsule (75 mg total) by mouth every 12 (twelve) hours. 01/24/22  Yes Rodriguez-Southworth, Nettie Elm, PA-C  albuterol (PROVENTIL HFA;VENTOLIN HFA) 108 (90 Base) MCG/ACT inhaler Inhale 1-2 puffs into the lungs every 6 (six) hours as needed for wheezing or shortness of breath. 08/15/17   Everlene Other G, DO  albuterol (PROVENTIL HFA;VENTOLIN HFA) 108 (90 Base) MCG/ACT inhaler Inhale 2 puffs into the lungs every 4 (four) hours as needed. 04/13/18   Renford Dills, NP  levonorgestrel (MIRENA) 20 MCG/24HR IUD  1 each by Intrauterine route once.    [provider]    Family History Family History  Problem Relation Age of Onset   Colon cancer Maternal Uncle    Leukemia Maternal Grandmother    Skin cancer Maternal Grandfather    Breast cancer Neg Hx    Ovarian cancer Neg Hx    Diabetes Neg Hx     Social History Social History   Tobacco Use   Smoking status: Never   Smokeless tobacco: Never  Vaping Use   Vaping Use: Never used  Substance Use Topics   Alcohol use: Not Currently   Drug use: Never     Allergies   Azithromycin   Review of Systems Review of Systems  Constitutional:  Positive for activity change, appetite change, chills, diaphoresis and fatigue. Negative for fever.  HENT:  Positive for congestion and postnasal drip. Negative for ear discharge and ear pain.   Eyes:  Negative for discharge.  Respiratory:  Positive for cough, chest tightness, shortness of breath and wheezing.   Cardiovascular:  Negative for chest pain.  Musculoskeletal:  Positive for myalgias.  Neurological:  Positive for headaches.     Physical Exam Triage Vital Signs ED Triage Vitals  Enc Vitals Group     BP 01/24/22 1532 (!) 126/90     Pulse Rate 01/24/22 1532 99     Resp 01/24/22 1532 (!) 24     Temp 01/24/22 1532 98.9 F (37.2 C)     Temp Source 01/24/22 1532 Oral     SpO2 01/24/22 1532 100 %     Weight 01/24/22 1530 145 lb (65.8 kg)     Height 01/24/22 1530 5\' 3"  (1.6 m)     Head Circumference --      Peak Flow --      Pain Score 01/24/22 1530 0     Pain Loc --      Pain Edu? --      Excl. in GC? --    No data found.  Updated Vital Signs BP 129/84 (BP Location: Left Arm)   Pulse (!) 104   Temp 99.8 F (37.7 C) (Oral)   Resp 16   Ht 5\' 3"  (1.6 m)   Wt 145 lb (65.8 kg)   LMP 01/10/2022 (Approximate)   SpO2 100%   BMI 25.69 kg/m   Visual Acuity Right Eye Distance:   Left Eye Distance:   Bilateral Distance:    Right Eye Near:   Left Eye Near:    Bilateral  Near:     Physical Exam Physical Exam Constitutional:      General: He is not in acute distress.    Appearance: He is not toxic-appearing.  HENT:     Head: Normocephalic.     Right Ear: Tympanic membrane, ear canal and external ear normal.     Left Ear: Ear canal and external ear normal.     Nose: Nose normal.     Mouth/Throat:     Mouth: Mucous membranes are moist.     Pharynx: Oropharynx is clear.  Eyes:     General: No scleral icterus.    Conjunctiva/sclera: Conjunctivae normal.  Cardiovascular:     Rate and Rhythm: Normal rate and regular rhythm.     Heart sounds: No murmur heard.   Pulmonary:     Effort: Pulmonary effort is normal. No respiratory distress.     Breath sounds: Wheezing present all over and resolved after duo Neb.       Musculoskeletal:        General: Normal range of motion.     Cervical back: Neck supple.  Lymphadenopathy:     Cervical: No cervical adenopathy.  Skin:    General: Skin is warm and dry.     Findings: No rash.  Neurological:     Mental Status: He is alert and oriented to person, place, and time.     Gait: Gait normal.  Psychiatric:        Mood and Affect: Mood normal.        Behavior: Behavior normal.        Thought Content: Thought content normal.        Judgment: Judgment normal.    UC Treatments / Results  Labs (all labs ordered are listed, but only abnormal results are displayed) Labs Reviewed  RESP PANEL BY RT-PCR (FLU A&B, COVID) ARPGX2 - Abnormal; Notable for the following components:      Result Value   SARS Coronavirus 2 by RT PCR POSITIVE (*)    Influenza B by PCR POSITIVE (*)    All other components within  normal limits    EKG   Radiology DG Chest 2 View  Addendum Date: 01/24/2022   ADDENDUM REPORT: 01/24/2022 19:36 CLINICAL DATA:  Shortness of breath. EXAM: Chest two views COMPARISON:  Chest x-ray with ribs 04/13/2018. FINDINGS: The lungs are clear. There is no pleural effusion or pneumothorax. The  cardiomediastinal silhouette is within normal limits. No acute fractures are identified. IMPRESSION: No acute cardiopulmonary disease. Electronically Signed   By: Darliss Cheney M.D.   On: 01/24/2022 19:36   Result Date: 01/24/2022 CLINICAL DATA:  Shortness of breath. EXAM: CHEST - 2 VIEW COMPARISON:  Earlier same please call 71 FINDINGS: There is a bowel for gastric pickup suboptimal or collaterals going to ask her C1 meter put the toys outside LOC IMPRESSION: No active cardiopulmonary disease. Electronically Signed: By: Darliss Cheney M.D. On: 01/24/2022 19:08    Procedures Procedures (including critical care time)  Medications Ordered in UC Medications  ipratropium-albuterol (DUONEB) 0.5-2.5 (3) MG/3ML nebulizer solution 3 mL (3 mLs Nebulization Given 01/24/22 1911)    Initial Impression / Assessment and Plan / UC Course  I have reviewed the triage vital signs and the nursing notes.  Pertinent labs & imaging results that were available during my care of the patient were reviewed by me and considered in my medical decision making (see chart for details).   Influenza B Covid infection  I placed pt on Molnupiravir due to hx of asthma and poor lung capacity per her history, and Tamiflu. I found no interactions with them taken it together. She was advised to use her Albuterol Neb q 4-6 hours See instructions.  Final Clinical Impressions(s) / UC Diagnoses   Final diagnoses:  COVID-19 virus infection  Influenza B     Discharge Instructions      Your chest xray does not show signs of bacterial infection or any pneumonia. You have Covid infection and Influenza B Stay quarantined for 5 days from the onset of your symptoms and you may go out or work after that, but you need to wear a mask for 5 days.  I have sent prescriptions for both of them During covid infection your Nitric Oxyde gets low, and causes more shortness of breath, so try to consume foods rich in this.   Nitric oxide is a  gas we make within our own bodies from nitrates and nitrites found naturally in our foods. Good sources include dark green leafy vegetables like kale, arugula, Swiss Chard and spinach. Other great sources include beets, cabbage, cauliflower, carrots and broccoli.  Keep on monitoring your oxygen and if it drop to 92% at rest or lower, you need to get oxygen, so go to the ER.      ED Prescriptions     Medication Sig Dispense Auth. Provider   oseltamivir (TAMIFLU) 75 MG capsule Take 1 capsule (75 mg total) by mouth every 12 (twelve) hours. 10 capsule Rodriguez-Southworth, Nettie Elm, PA-C   molnupiravir EUA (LAGEVRIO) 200 mg CAPS capsule Take 4 capsules (800 mg total) by mouth 2 (two) times daily for 5 days. 40 capsule Rodriguez-Southworth, Nettie Elm, PA-C      PDMP not reviewed this encounter.   Garey Ham, [place] 01/24/22 1944

## 2022-01-24 NOTE — Discharge Instructions (Addendum)
Your chest xray does not show signs of bacterial infection or any pneumonia. You have Covid infection and Influenza B Stay quarantined for 5 days from the onset of your symptoms and you may go out or work after that, but you need to wear a mask for 5 days.  I have sent prescriptions for both of them During covid infection your Nitric Oxyde gets low, and causes more shortness of breath, so try to consume foods rich in this.   Nitric oxide is a gas we make within our own bodies from nitrates and nitrites found naturally in our foods. Good sources include dark green leafy vegetables like kale, arugula, Swiss Chard and spinach. Other great sources include beets, cabbage, cauliflower, carrots and broccoli.  Keep on monitoring your oxygen and if it drop to 92% at rest or lower, you need to get oxygen, so go to the ER.

## 2022-01-24 NOTE — ED Triage Notes (Signed)
Pt c/o sinus drainage,dry cough,sob ongoing x3 days. Pt denies any fevers,chills,muscle aches. Has taken her albuterol w/o relief, mucinex & pseudophed w/o relief.

## 2022-11-22 ENCOUNTER — Ambulatory Visit: Payer: 59 | Admitting: Physician Assistant

## 2022-11-25 ENCOUNTER — Encounter: Payer: Self-pay | Admitting: Physician Assistant

## 2022-11-25 ENCOUNTER — Ambulatory Visit: Payer: No Typology Code available for payment source | Admitting: Physician Assistant

## 2022-11-25 VITALS — BP 120/80 | HR 84 | Temp 98.6°F | Ht 63.0 in | Wt 165.0 lb

## 2022-11-25 DIAGNOSIS — F419 Anxiety disorder, unspecified: Secondary | ICD-10-CM | POA: Diagnosis not present

## 2022-11-25 DIAGNOSIS — Z975 Presence of (intrauterine) contraceptive device: Secondary | ICD-10-CM | POA: Insufficient documentation

## 2022-11-25 DIAGNOSIS — F331 Major depressive disorder, recurrent, moderate: Secondary | ICD-10-CM | POA: Diagnosis not present

## 2022-11-25 MED ORDER — HYDROXYZINE HCL 25 MG PO TABS: 25.0000 mg | ORAL_TABLET | Freq: Three times a day (TID) | ORAL | 0 refills | Status: DC | PRN

## 2022-11-25 MED ORDER — FLUOXETINE HCL 10 MG PO TABS: 10.0000 mg | ORAL_TABLET | Freq: Every day | ORAL | 1 refills | Status: DC

## 2022-11-25 NOTE — Progress Notes (Signed)
Date:  11/25/2022   Name:  Teresa Sutton   DOB:  February 18, 1986   MRN:  [id number]   Chief Complaint: Establish Care, IUD removal  (Referral for GYN), and Anxiety and Depression  (Has never been on medication )  HPI Teresa Sutton is a pleasant 37 year old female with a history of mild intermittent asthma new to the practice today to establish care.   She would like referral to GYN for removal of IUD which was placed 5 years ago for dysmenorrhea.  She is thinking she might want hysterectomy.  History of tubal ligation.  Also due for Pap.  She has also been struggling with depression since a teenager.  Multiple close family losses since 2019 including her father, stepfather, stepmother, and father-in-law.  Several of these have been traumatic, and all have been very emotionally difficult.  Patient endorses daily symptoms of anxiety and depression, history of insomnia, panic like symptoms.  She has never taken any medication for this problem is not currently have counseling/therapy.  She once took Ambien for insomnia and reports homicidal ideation with this.   At the end of the visit she mentioned she might have scoliosis, but we deferred this complaint to a later date.  Medication list has been reviewed and updated.  Current Meds  Medication Sig   albuterol (PROVENTIL HFA;VENTOLIN HFA) 108 (90 Base) MCG/ACT inhaler Inhale 2 puffs into the lungs every 4 (four) hours as needed.   FLUoxetine (PROZAC) 10 MG tablet Take 1 tablet (10 mg total) by mouth daily.   hydrOXYzine (ATARAX) 25 MG tablet Take 1 tablet (25 mg total) by mouth 3 (three) times daily as needed for anxiety. May cause sedation   levonorgestrel (MIRENA) 20 MCG/24HR IUD 1 each by Intrauterine route once.     Review of Systems  Constitutional:  Positive for fatigue. Negative for fever.  Respiratory:  Negative for chest tightness and shortness of breath.   Cardiovascular:  Negative for chest pain and palpitations.   Gastrointestinal:  Negative for abdominal pain.  Psychiatric/Behavioral:  Positive for decreased concentration, dysphoric mood and sleep disturbance. Negative for self-injury and suicidal ideas. The patient is nervous/anxious.     Patient Active Problem List   Diagnosis Date Noted   IUD (intrauterine device) in place 11/25/2022   Anxiety 11/25/2022   Moderate episode of recurrent major depressive disorder (HCC) 11/25/2022    Allergies  Allergen Reactions   Ambien [Zolpidem] Other (See Comments)    Homicidal ideation   Azithromycin Other (See Comments) and Rash    Immunization History  Administered Date(s) Administered   PPD Test 12/21/2020    Past Surgical History:  Procedure Laterality Date   TONSILLECTOMY     TUBAL LIGATION      Social History   Tobacco Use   Smoking status: Never   Smokeless tobacco: Never  Vaping Use   Vaping status: Never Used  Substance Use Topics   Alcohol use: Not Currently   Drug use: Never    Family History  Problem Relation Age of Onset   Hypertension Mother    Hyperlipidemia Mother    Colon cancer Maternal Uncle    Diabetes Maternal Grandmother    Cancer Maternal Grandmother    Leukemia Maternal Grandmother    Diabetes Maternal Grandfather    Skin cancer Maternal Grandfather    Breast cancer Neg Hx    Ovarian cancer Neg Hx         11/25/2022    2:41 PM  GAD 7 :  Generalized Anxiety Score  Nervous, Anxious, on Edge 3  Control/stop worrying 2  Worry too much - different things 2  Trouble relaxing 1  Restless 0  Easily annoyed or irritable 1  Afraid - awful might happen 2  Total GAD 7 Score 11  Anxiety Difficulty Somewhat difficult       11/25/2022    2:41 PM  Depression screen PHQ 2/9  Decreased Interest 0  Down, Depressed, Hopeless 2  PHQ - 2 Score 2  Altered sleeping 3  Tired, decreased energy 0  Change in appetite 0  Feeling bad or failure about yourself  1  Trouble concentrating 1  Moving slowly or  fidgety/restless 0  Suicidal thoughts 0  PHQ-9 Score 7  Difficult doing work/chores Somewhat difficult    BP Readings from Last 3 Encounters:  11/25/22 120/80  01/24/22 129/84  04/13/18 107/81    Wt Readings from Last 3 Encounters:  11/25/22 165 lb (74.8 kg)  01/24/22 145 lb (65.8 kg)  04/13/18 140 lb (63.5 kg)    BP 120/80   Pulse 84   Temp 98.6 F (37 C) (Oral)   Ht 5\' 3"  (1.6 m)   Wt 165 lb (74.8 kg)   SpO2 98%   BMI 29.23 kg/m   Physical Exam Vitals and nursing note reviewed.  Constitutional:      Appearance: Normal appearance.  Cardiovascular:     Rate and Rhythm: Normal rate.  Pulmonary:     Effort: Pulmonary effort is normal.  Abdominal:     General: There is no distension.  Musculoskeletal:        General: Normal range of motion.  Skin:    General: Skin is warm and dry.  Neurological:     Mental Status: She is alert and oriented to person, place, and time.     Gait: Gait is intact.  Psychiatric:        Attention and Perception: Attention and perception normal.        Mood and Affect: Affect normal. Mood is anxious and depressed.        Speech: Speech normal.        Behavior: Behavior normal.        Thought Content: Thought content normal.        Cognition and Memory: Cognition normal.        Judgment: Judgment normal.     Recent Labs  No results found for: "NA", "K", "CL", "CO2", "GLUCOSE", "BUN", "CREATININE", "CALCIUM", "PROT", "ALBUMIN", "AST", "ALT", "ALKPHOS", "BILITOT", "GFRNONAA", "GFRAA"  No results found for: "WBC", "HGB", "HCT", "MCV", "PLT" No results found for: "HGBA1C" No results found for: "CHOL", "HDL", "LDLCALC", "LDLDIRECT", "TRIG", "CHOLHDL" No results found for: "TSH"   Assessment and Plan:  1. Moderate episode of recurrent major depressive disorder (HCC) Try low-dose fluoxetine as below.  Offered behavioral health referral, but declined at this time. - FLUoxetine (PROZAC) 10 MG tablet; Take 1 tablet (10 mg total) by mouth  daily.  Dispense: 30 tablet; Refill: 1  2. Anxiety Trial low-dose hydroxyzine as below as needed.  Patient advised this may be sedating and she should take her first dose at night to gauge effect.  At the very least this should help with some of her insomnia. - hydrOXYzine (ATARAX) 25 MG tablet; Take 1 tablet (25 mg total) by mouth 3 (three) times daily as needed for anxiety. May cause sedation  Dispense: 30 tablet; Refill: 0  3. IUD (intrauterine device) in place Referred to GYN for  IUD removal and consideration of hysterectomy - Ambulatory referral to Gynecology   Return in about 4 weeks (around 12/23/2022) for OV f/u dep/anx and ? scoliosis.    Alvester Morin, PA-C, DMSc, Nutritionist [facility] [phone]

## 2022-12-04 ENCOUNTER — Encounter: Payer: Self-pay | Admitting: Physician Assistant

## 2022-12-04 ENCOUNTER — Ambulatory Visit: Payer: No Typology Code available for payment source | Admitting: Physician Assistant

## 2022-12-04 VITALS — BP 90/60 | HR 75 | Temp 98.2°F | Ht 63.0 in | Wt 161.0 lb

## 2022-12-04 DIAGNOSIS — I959 Hypotension, unspecified: Secondary | ICD-10-CM | POA: Diagnosis not present

## 2022-12-04 DIAGNOSIS — J0100 Acute maxillary sinusitis, unspecified: Secondary | ICD-10-CM | POA: Diagnosis not present

## 2022-12-04 MED ORDER — AMOXICILLIN 875 MG PO TABS: 875.0000 mg | ORAL_TABLET | Freq: Two times a day (BID) | ORAL | 0 refills | Status: AC

## 2022-12-04 NOTE — Progress Notes (Addendum)
Date:  12/04/2022   Name:  Teresa Sutton   DOB:  1986-01-17   MRN:  [id number]   Chief Complaint: Cough and Sinusitis  Sinusitis This is a new problem. Episode onset: X 1 week. The problem has been gradually worsening since onset. There has been no fever. Her pain is at a severity of 0/10. She is experiencing no pain. Associated symptoms include congestion, coughing and sinus pressure. Pertinent negatives include no shortness of breath. (Ear fullness on left side ) Past treatments include nasal decongestants and oral decongestants. The treatment provided mild relief.   Teresa Sutton presents for evaluation of suspected sinusitis with 1 week symptoms as described above.  Patient feels the cough is directly related to postnasal drip, and despite having asthma she does not report any significant wheezing or shortness of breath.  No fever.  Using Mucinex and OTC Sudafed PE  Gives a good report on the fluoxetine and hydroxyzine prescribed last visit, seems to be working well for her.   Medication list has been reviewed and updated.  Current Meds  Medication Sig   albuterol (PROVENTIL HFA;VENTOLIN HFA) 108 (90 Base) MCG/ACT inhaler Inhale 2 puffs into the lungs every 4 (four) hours as needed.   amoxicillin (AMOXIL) 875 MG tablet Take 1 tablet (875 mg total) by mouth 2 (two) times daily for 5 days.   FLUoxetine (PROZAC) 10 MG tablet Take 1 tablet (10 mg total) by mouth daily.   hydrOXYzine (ATARAX) 25 MG tablet Take 1 tablet (25 mg total) by mouth 3 (three) times daily as needed for anxiety. May cause sedation   levonorgestrel (MIRENA) 20 MCG/24HR IUD 1 each by Intrauterine route once.     Review of Systems  Constitutional:  Negative for fatigue and fever.  HENT:  Positive for congestion, postnasal drip and sinus pressure.        Left aural fullness  Respiratory:  Positive for cough. Negative for chest tightness and shortness of breath.   Cardiovascular:  Negative for chest pain and  palpitations.  Gastrointestinal:  Negative for abdominal pain.    Patient Active Problem List   Diagnosis Date Noted   IUD (intrauterine device) in place 11/25/2022   Anxiety 11/25/2022   Moderate episode of recurrent major depressive disorder (HCC) 11/25/2022    Allergies  Allergen Reactions   Ambien [Zolpidem] Other (See Comments)    Homicidal ideation   Azithromycin Other (See Comments) and Rash    Immunization History  Administered Date(s) Administered   PPD Test 12/21/2020    Past Surgical History:  Procedure Laterality Date   TONSILLECTOMY     TUBAL LIGATION      Social History   Tobacco Use   Smoking status: Never   Smokeless tobacco: Never  Vaping Use   Vaping status: Never Used  Substance Use Topics   Alcohol use: Not Currently   Drug use: Never    Family History  Problem Relation Age of Onset   Hypertension Mother    Hyperlipidemia Mother    Colon cancer Maternal Uncle    Diabetes Maternal Grandmother    Cancer Maternal Grandmother    Leukemia Maternal Grandmother    Diabetes Maternal Grandfather    Skin cancer Maternal Grandfather    Breast cancer Neg Hx    Ovarian cancer Neg Hx         12/04/2022   10:54 AM 11/25/2022    2:41 PM  GAD 7 : Generalized Anxiety Score  Nervous, Anxious, on Edge 3  3  Control/stop worrying 2 2  Worry too much - different things 2 2  Trouble relaxing 1 1  Restless 0 0  Easily annoyed or irritable 1 1  Afraid - awful might happen 2 2  Total GAD 7 Score 11 11  Anxiety Difficulty Somewhat difficult Somewhat difficult       12/04/2022   10:53 AM 11/25/2022    2:41 PM  Depression screen PHQ 2/9  Decreased Interest 1 0  Down, Depressed, Hopeless 2 2  PHQ - 2 Score 3 2  Altered sleeping 2 3  Tired, decreased energy 1 0  Change in appetite 0 0  Feeling bad or failure about yourself  1 1  Trouble concentrating 1 1  Moving slowly or fidgety/restless 0 0  Suicidal thoughts 0 0  PHQ-9 Score 8 7  Difficult  doing work/chores Not difficult at all Somewhat difficult    BP Readings from Last 3 Encounters:  12/04/22 90/60  11/25/22 120/80  01/24/22 129/84    Wt Readings from Last 3 Encounters:  12/04/22 161 lb (73 kg)  11/25/22 165 lb (74.8 kg)  01/24/22 145 lb (65.8 kg)    BP 90/60 (BP Location: Left Arm, Patient Position: Sitting, Cuff Size: Large)   Pulse 75   Temp 98.2 F (36.8 C) (Oral)   Ht 5\' 3"  (1.6 m)   Wt 161 lb (73 kg)   SpO2 98%   BMI 28.52 kg/m   Physical Exam Vitals and nursing note reviewed.  Constitutional:      General: She is not in acute distress.    Appearance: Normal appearance.  HENT:     Right Ear: Tympanic membrane normal.     Left Ear: Tympanic membrane normal.     Ears:     Comments: EAC clear bilaterally with good view of TM which is without effusion or erythema.     Nose: Congestion present.     Right Sinus: Maxillary sinus tenderness present.     Left Sinus: Maxillary sinus tenderness present.     Mouth/Throat:     Mouth: Mucous membranes are moist.     Pharynx: No oropharyngeal exudate or posterior oropharyngeal erythema.  Eyes:     Conjunctiva/sclera:     Right eye: Right conjunctiva is injected.     Left eye: Left conjunctiva is injected.     Pupils: Pupils are equal, round, and reactive to light.     Comments: Moderate injection of bilateral palpebral conjunctivae  Cardiovascular:     Rate and Rhythm: Normal rate and regular rhythm.     Heart sounds: No murmur heard.    No friction rub. No gallop.  Pulmonary:     Effort: Pulmonary effort is normal.     Breath sounds: Normal breath sounds. No wheezing, rhonchi or rales.  Lymphadenopathy:     Cervical: Cervical adenopathy present.     Right cervical: Superficial cervical adenopathy present.     Recent Labs  No results found for: "NA", "K", "CL", "CO2", "GLUCOSE", "BUN", "CREATININE", "CALCIUM", "PROT", "ALBUMIN", "AST", "ALT", "ALKPHOS", "BILITOT", "GFRNONAA", "GFRAA"  No results  found for: "WBC", "HGB", "HCT", "MCV", "PLT" No results found for: "HGBA1C" No results found for: "CHOL", "HDL", "LDLCALC", "LDLDIRECT", "TRIG", "CHOLHDL" No results found for: "TSH"   Assessment and Plan:  1. Acute non-recurrent maxillary sinusitis Given the duration of symptoms without improvement, and the frontal sinus tenderness appreciated on exam today, we will begin treatment with amoxicillin as below.  Work note written, may return  on Monday, 12/09/2022.  Also advised patient to take behind-the-counter pseudoephedrine as directed on the label.  This will likely also improve her blood pressure.  - amoxicillin (AMOXIL) 875 MG tablet; Take 1 tablet (875 mg total) by mouth 2 (two) times daily for 5 days.  Dispense: 10 tablet; Refill: 0  2. Hypotension, unspecified hypotension type Noted to be hypotensive x 2 in clinic today.  Patient states this is her baseline, usually 90s-100s systolic, generally asymptomatic but occasionally feels lightheaded.   Return if symptoms worsen or fail to improve.    Alvester Morin, PA-C, DMSc, Nutritionist [facility] Primary [facility] and [facility] [phone]

## 2022-12-26 ENCOUNTER — Encounter: Payer: Self-pay | Admitting: Physician Assistant

## 2022-12-26 ENCOUNTER — Ambulatory Visit
Admission: RE | Admit: 2022-12-26 | Discharge: 2022-12-26 | Disposition: A | Discharge status: Home / Self Care | Payer: Commercial Managed Care - PPO | Source: Ambulatory Visit | Attending: Physician Assistant | Admitting: Physician Assistant

## 2022-12-26 ENCOUNTER — Ambulatory Visit
Admission: RE | Admit: 2022-12-26 | Discharge: 2022-12-26 | Disposition: A | Discharge status: Home / Self Care | Payer: Commercial Managed Care - PPO | Attending: Physician Assistant | Admitting: Physician Assistant

## 2022-12-26 ENCOUNTER — Ambulatory Visit: Payer: No Typology Code available for payment source | Admitting: Physician Assistant

## 2022-12-26 VITALS — BP 108/78 | HR 78 | Ht 63.0 in | Wt 160.0 lb

## 2022-12-26 DIAGNOSIS — F331 Major depressive disorder, recurrent, moderate: Secondary | ICD-10-CM

## 2022-12-26 DIAGNOSIS — Z862 Personal history of diseases of the blood and blood-forming organs and certain disorders involving the immune mechanism: Secondary | ICD-10-CM

## 2022-12-26 DIAGNOSIS — G8929 Other chronic pain: Secondary | ICD-10-CM | POA: Insufficient documentation

## 2022-12-26 DIAGNOSIS — I959 Hypotension, unspecified: Secondary | ICD-10-CM | POA: Diagnosis not present

## 2022-12-26 DIAGNOSIS — M5442 Lumbago with sciatica, left side: Secondary | ICD-10-CM | POA: Diagnosis present

## 2022-12-26 DIAGNOSIS — J189 Pneumonia, unspecified organism: Secondary | ICD-10-CM

## 2022-12-26 DIAGNOSIS — F419 Anxiety disorder, unspecified: Secondary | ICD-10-CM

## 2022-12-26 MED ORDER — BENZONATATE 200 MG PO CAPS: 200.0000 mg | ORAL_CAPSULE | Freq: Three times a day (TID) | ORAL | 0 refills | Status: DC | PRN

## 2022-12-26 MED ORDER — DOXYCYCLINE HYCLATE 100 MG PO TABS: 100.0000 mg | ORAL_TABLET | Freq: Two times a day (BID) | ORAL | 0 refills | Status: AC

## 2022-12-26 MED ORDER — FLUOXETINE HCL 10 MG PO TABS: 10.0000 mg | ORAL_TABLET | Freq: Every day | ORAL | 1 refills | Status: DC
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
## 2022-12-27 LAB — CBC WITH DIFFERENTIAL/PLATELET
Basophils Absolute: 0 10*3/uL (ref 0.0–0.2)
Basos: 1 %
EOS (ABSOLUTE): 0.1 10*3/uL (ref 0.0–0.4)
Eos: 2 %
Hematocrit: 40.1 % (ref 34.0–46.6)
Hemoglobin: 13.5 g/dL (ref 11.1–15.9)
Immature Grans (Abs): 0 10*3/uL (ref 0.0–0.1)
Immature Granulocytes: 1 %
Lymphocytes Absolute: 1.4 10*3/uL (ref 0.7–3.1)
Lymphs: 23 %
MCH: 31.6 pg (ref 26.6–33.0)
MCHC: 33.7 g/dL (ref 31.5–35.7)
MCV: 94 fL (ref 79–97)
Monocytes Absolute: 0.4 10*3/uL (ref 0.1–0.9)
Monocytes: 6 %
Neutrophils Absolute: 4.1 10*3/uL (ref 1.4–7.0)
Neutrophils: 67 %
Platelets: 320 10*3/uL (ref 150–450)
RBC: 4.27 x10E6/uL (ref 3.77–5.28)
RDW: 11.8 % (ref 11.7–15.4)
WBC: 6 10*3/uL (ref 3.4–10.8)

## 2022-12-27 LAB — TSH: TSH: 1.08 u[IU]/mL (ref 0.450–4.500)

## 2022-12-27 LAB — COMPREHENSIVE METABOLIC PANEL
ALT: 12 [IU]/L (ref 0–32)
AST: 15 [IU]/L (ref 0–40)
Albumin: 4.6 g/dL (ref 3.9–4.9)
Alkaline Phosphatase: 67 [IU]/L (ref 44–121)
BUN/Creatinine Ratio: 11 (ref 9–23)
BUN: 7 mg/dL (ref 6–20)
Bilirubin Total: 0.4 mg/dL (ref 0.0–1.2)
CO2: 25 mmol/L (ref 20–29)
Calcium: 9.6 mg/dL (ref 8.7–10.2)
Chloride: 103 mmol/L (ref 96–106)
Creatinine, Ser: 0.65 mg/dL (ref 0.57–1.00)
Globulin, Total: 2.3 g/dL (ref 1.5–4.5)
Glucose: 95 mg/dL (ref 70–99)
Potassium: 4.3 mmol/L (ref 3.5–5.2)
Sodium: 141 mmol/L (ref 134–144)
Total Protein: 6.9 g/dL (ref 6.0–8.5)
eGFR: 118 mL/min/{1.73_m2} (ref >59)

## 2023-01-23 ENCOUNTER — Ambulatory Visit: Payer: No Typology Code available for payment source | Admitting: Physician Assistant

## 2023-01-23 ENCOUNTER — Encounter: Payer: Self-pay | Admitting: Physician Assistant

## 2023-01-23 VITALS — BP 110/76 | HR 87 | Temp 98.3°F | Ht 63.0 in | Wt 155.0 lb

## 2023-01-23 DIAGNOSIS — G43909 Migraine, unspecified, not intractable, without status migrainosus: Secondary | ICD-10-CM | POA: Diagnosis not present

## 2023-01-23 MED ORDER — UBRELVY 100 MG PO TABS: 1.0000 | ORAL_TABLET | Freq: Once | ORAL | Status: AC

## 2023-01-23 MED ORDER — PROMETHAZINE HCL 25 MG PO TABS: 12.5000 mg | ORAL_TABLET | Freq: Four times a day (QID) | ORAL | 0 refills | Status: DC | PRN

## 2023-01-23 MED ORDER — NURTEC 75 MG PO TBDP: 1.0000 | ORAL_TABLET | Freq: Every day | ORAL | Status: AC | PRN

## 2023-01-23 MED ORDER — BUTALBITAL-APAP-CAFFEINE 50-325-40 MG PO TABS: 1.0000 | ORAL_TABLET | Freq: Four times a day (QID) | ORAL | 0 refills | Status: DC | PRN

## 2023-01-23 NOTE — Progress Notes (Signed)
Date:  01/23/2023   Name:  Teresa Sutton   DOB:  1985/09/18   MRN:  [id number]   Chief Complaint: Headache  Migraine  This is a new problem. Episode onset: X3 days. The problem has been gradually worsening. The pain is located in the Temporal and parietal region. The pain radiates to the face. The pain quality is similar to prior headaches. The quality of the pain is described as throbbing. The pain is at a severity of 8/10. The pain is moderate. Associated symptoms comments: Near syncope . The symptoms are aggravated by bright light and noise (smells). She has tried acetaminophen and NSAIDs for the symptoms. The treatment provided no relief.   Dorthie presents today for evaluation of acute migraine, worsening over the last couple days.  She estimates it has been at least 10 years since her last migraine and so she does not have any prescription migraine medications.  OTC analgesics not helpful.  Associated photophonophobia, nausea, vomiting.  Has not been able to keep anything down today.   Medication list has been reviewed and updated.  Current Meds  Medication Sig   albuterol (PROVENTIL HFA;VENTOLIN HFA) 108 (90 Base) MCG/ACT inhaler Inhale 2 puffs into the lungs every 4 (four) hours as needed.   butalbital-acetaminophen-caffeine (FIORICET) 50-325-40 MG tablet Take 1 tablet by mouth every 6 (six) hours as needed for headache.   FLUoxetine (PROZAC) 10 MG tablet Take 1 tablet (10 mg total) by mouth daily.   hydrOXYzine (ATARAX) 25 MG tablet Take 1 tablet (25 mg total) by mouth 3 (three) times daily as needed for anxiety. May cause sedation   phenylephrine (SUDAFED PE) 10 MG TABS tablet Take 10 mg by mouth every 4 (four) hours as needed.   promethazine (PHENERGAN) 25 MG tablet Take 0.5-1 tablets (12.5-25 mg total) by mouth every 6 (six) hours as needed for nausea or vomiting.   Rimegepant Sulfate (NURTEC) 75 MG TBDP Take 1 tablet (75 mg total) by mouth daily as needed for up to 2  days.   Ubrogepant (UBRELVY) 100 MG TABS Take 1 tablet (100 mg total) by mouth once for 1 dose.   [DISCONTINUED] benzonatate (TESSALON) 200 MG capsule Take 1 capsule (200 mg total) by mouth 3 (three) times daily as needed for cough.   [DISCONTINUED] levonorgestrel (MIRENA) 20 MCG/24HR IUD 1 each by Intrauterine route once.     Review of Systems  Patient Active Problem List   Diagnosis Date Noted   Chronic midline low back pain with left-sided sciatica 12/26/2022   IUD (intrauterine device) in place 11/25/2022   Anxiety 11/25/2022   Moderate episode of recurrent major depressive disorder (HCC) 11/25/2022    Allergies  Allergen Reactions   Ambien [Zolpidem] Other (See Comments)    Homicidal ideation   Azithromycin Other (See Comments) and Rash    Immunization History  Administered Date(s) Administered   PPD Test 12/21/2020    Past Surgical History:  Procedure Laterality Date   TONSILLECTOMY     TUBAL LIGATION      Social History   Tobacco Use   Smoking status: Never   Smokeless tobacco: Never  Vaping Use   Vaping status: Never Used  Substance Use Topics   Alcohol use: Not Currently   Drug use: Never    Family History  Problem Relation Age of Onset   Hypertension Mother    Hyperlipidemia Mother    Colon cancer Maternal Uncle    Diabetes Maternal Grandmother    Cancer  Maternal Grandmother    Leukemia Maternal Grandmother    Diabetes Maternal Grandfather    Skin cancer Maternal Grandfather    Breast cancer Neg Hx    Ovarian cancer Neg Hx         01/23/2023    4:32 PM 12/26/2022    2:57 PM 12/04/2022   10:54 AM 11/25/2022    2:41 PM  GAD 7 : Generalized Anxiety Score  Nervous, Anxious, on Edge 0 1 3 3   Control/stop worrying 0 1 2 2   Worry too much - different things 0 2 2 2   Trouble relaxing 1 1 1 1   Restless 1 1 0 0  Easily annoyed or irritable 1 1 1 1   Afraid - awful might happen 0 1 2 2   Total GAD 7 Score 3 8 11 11   Anxiety Difficulty  Not  difficult at all Somewhat difficult Somewhat difficult       01/23/2023    4:33 PM 12/26/2022    2:57 PM 12/04/2022   10:53 AM  Depression screen PHQ 2/9  Decreased Interest 0 1 1  Down, Depressed, Hopeless 0 1 2  PHQ - 2 Score 0 2 3  Altered sleeping 0 3 2  Tired, decreased energy 0 1 1  Change in appetite 0 0 0  Feeling bad or failure about yourself  0 1 1  Trouble concentrating 0 0 1  Moving slowly or fidgety/restless 0 0 0  Suicidal thoughts 0 0 0  PHQ-9 Score 0 7 8  Difficult doing work/chores Not difficult at all Not difficult at all Not difficult at all    BP Readings from Last 3 Encounters:  01/23/23 110/76  12/26/22 108/78  12/04/22 90/60    Wt Readings from Last 3 Encounters:  01/23/23 155 lb (70.3 kg)  12/26/22 160 lb (72.6 kg)  12/04/22 161 lb (73 kg)    BP 110/76   Pulse 87   Temp 98.3 F (36.8 C) (Oral)   Ht 5\' 3"  (1.6 m)   Wt 155 lb (70.3 kg)   SpO2 95%   BMI 27.46 kg/m   Physical Exam Vitals and nursing note reviewed.  Constitutional:      Appearance: Normal appearance.     Comments: Patient is seated on the floor of the dimmed exam room resting her head on the exam chair with sunglasses and hoodie.  She is clearly unwell.  Emesis bag nearby but unused  Cardiovascular:     Rate and Rhythm: Normal rate.  Pulmonary:     Effort: Pulmonary effort is normal.  Abdominal:     General: There is no distension.  Musculoskeletal:        General: Normal range of motion.  Skin:    General: Skin is warm and dry.  Neurological:     Mental Status: She is oriented to person, place, and time.     Gait: Gait is intact.  Psychiatric:        Mood and Affect: Mood and affect normal.     Recent Labs     Component Value Date/Time   NA 141 12/26/2022 1527   K 4.3 12/26/2022 1527   CL 103 12/26/2022 1527   CO2 25 12/26/2022 1527   GLUCOSE 95 12/26/2022 1527   BUN 7 12/26/2022 1527   CREATININE 0.65 12/26/2022 1527   CALCIUM 9.6 12/26/2022 1527   PROT  6.9 12/26/2022 1527   ALBUMIN 4.6 12/26/2022 1527   AST 15 12/26/2022 1527   ALT 12  12/26/2022 1527   ALKPHOS 67 12/26/2022 1527   BILITOT 0.4 12/26/2022 1527    Lab Results  Component Value Date   WBC 6.0 12/26/2022   HGB 13.5 12/26/2022   HCT 40.1 12/26/2022   MCV 94 12/26/2022   PLT 320 12/26/2022   No results found for: "HGBA1C" No results found for: "CHOL", "HDL", "LDLCALC", "LDLDIRECT", "TRIG", "CHOLHDL" Lab Results  Component Value Date   TSH 1.080 12/26/2022     Assessment and Plan:  1. Acute migraine Patient given samples of Nurtec and Ubrelvy today.  Advised to take the Nurtec now, as it is disintegrating.  Advised not to take another dose of Nurtec or Ubrelvy for another 24 hours, and only if needed.  Also prescribing promethazine and Fioricet for symptomatic relief. - promethazine (PHENERGAN) 25 MG tablet; Take 0.5-1 tablets (12.5-25 mg total) by mouth every 6 (six) hours as needed for nausea or vomiting.  Dispense: 30 tablet; Refill: 0 - butalbital-acetaminophen-caffeine (FIORICET) 50-325-40 MG tablet; Take 1 tablet by mouth every 6 (six) hours as needed for headache.  Dispense: 14 tablet; Refill: 0 - Rimegepant Sulfate (NURTEC) 75 MG TBDP; Take 1 tablet (75 mg total) by mouth daily as needed for up to 2 days. - Ubrogepant (UBRELVY) 100 MG TABS; Take 1 tablet (100 mg total) by mouth once for 1 dose.  Dispense: 1 tablet    F/u PRN   Alvester Morin, PA-C, DMSc, Nutritionist [facility] [phone]

## 2023-01-24 ENCOUNTER — Telehealth: Payer: Self-pay | Admitting: Physician Assistant

## 2023-01-24 NOTE — Telephone Encounter (Signed)
Courtesy call to patient from PCP following up on acute migraine from yesterday.  Patient states the Nurtec yesterday helped, and Fioricet got her to sleep last night.  She feels much better today and is back at work.

## 2023-01-30 ENCOUNTER — Ambulatory Visit: Payer: No Typology Code available for payment source | Admitting: Physician Assistant

## 2023-02-21 ENCOUNTER — Ambulatory Visit: Payer: No Typology Code available for payment source | Admitting: Physician Assistant

## 2023-02-21 ENCOUNTER — Encounter: Payer: Self-pay | Admitting: Physician Assistant

## 2023-02-21 VITALS — BP 102/66 | HR 77 | Temp 98.3°F | Ht 63.0 in | Wt 158.0 lb

## 2023-02-21 DIAGNOSIS — F419 Anxiety disorder, unspecified: Secondary | ICD-10-CM | POA: Diagnosis not present

## 2023-02-21 DIAGNOSIS — J310 Chronic rhinitis: Secondary | ICD-10-CM | POA: Diagnosis not present

## 2023-02-21 DIAGNOSIS — F331 Major depressive disorder, recurrent, moderate: Secondary | ICD-10-CM

## 2023-02-21 MED ORDER — HYDROXYZINE HCL 25 MG PO TABS: 25.0000 mg | ORAL_TABLET | Freq: Three times a day (TID) | ORAL | 1 refills | Status: DC | PRN

## 2023-02-21 MED ORDER — FLUOXETINE HCL 20 MG PO TABS: 20.0000 mg | ORAL_TABLET | Freq: Every day | ORAL | 1 refills | Status: DC

## 2023-02-21 MED ORDER — LEVOCETIRIZINE DIHYDROCHLORIDE 5 MG PO TABS: 5.0000 mg | ORAL_TABLET | Freq: Every evening | ORAL | Status: DC

## 2023-02-21 NOTE — Patient Instructions (Signed)
 -  It was a pleasure to see you today! Please review your visit summary for helpful information -I would encourage you to follow your care via MyChart where you can access lab results, notes, messages, and more -If you feel that we did a nice job today, please complete your after-visit survey and leave [place] a Google review! Your CMA today was Mariann Barter and your provider was Alvester Morin, PA-C, DMSc -Please return for follow-up in about 3 months

## 2023-02-21 NOTE — Progress Notes (Signed)
 Date:  02/21/2023   Name:  Teresa Sutton   DOB:  04/05/1985   MRN:  [id number]   Chief Complaint: Anxiety (1 month not taking )  HPI Teresa Sutton presents today for follow-up on anxiety and depression having run out of Prozac  about a month ago despite a prescription written 12/26/2022 for 90 tablets; apparently she called the pharmacy and they told her they did not have a refill available for her.  Regardless, she would like to resume fluoxetine  but with dose increase to 20 mg daily.  Also requesting refill on hydroxyzine  to help with sleep and anxiety.  Separately mentions recent sinus congestion, wonders if allergy to live Christmas tree.  No fever or sinus pain, says she is able to clear the mucus from her nose easily with blowing.  She reports very positive experience with new GYN Dr. Leonce at [place] [facility], planning for TVUS 03/18/2023 to further evaluate menorrhagia.  Patient believes she will be proceeding with hysterectomy.   Medication list has been reviewed and updated.  Current Meds  Medication Sig   albuterol  (PROVENTIL  HFA;VENTOLIN  HFA) 108 (90 Base) MCG/ACT inhaler Inhale 2 puffs into the lungs every 4 (four) hours as needed.   butalbital -acetaminophen-caffeine  (FIORICET) 50-325-40 MG tablet Take 1 tablet by mouth every 6 (six) hours as needed for headache.   levocetirizine (XYZAL  ALLERGY 24HR) 5 MG tablet Take 1 tablet (5 mg total) by mouth every evening.   promethazine  (PHENERGAN ) 25 MG tablet Take 0.5-1 tablets (12.5-25 mg total) by mouth every 6 (six) hours as needed for nausea or vomiting.   pseudoephedrine (SUDAFED) 120 MG 12 hr tablet Take 120 mg by mouth every 12 (twelve) hours as needed for congestion.   [DISCONTINUED] phenylephrine (SUDAFED PE) 10 MG TABS tablet Take 10 mg by mouth every 4 (four) hours as needed.     Review of Systems  Patient Active Problem List   Diagnosis Date Noted   Chronic midline low back pain with left-sided sciatica 12/26/2022    IUD (intrauterine device) in place 11/25/2022   Anxiety 11/25/2022   Moderate episode of recurrent major depressive disorder (HCC) 11/25/2022    Allergies  Allergen Reactions   Ambien [Zolpidem] Other (See Comments)    Homicidal ideation   Azithromycin Other (See Comments) and Rash    Immunization History  Administered Date(s) Administered   PPD Test 12/21/2020    Past Surgical History:  Procedure Laterality Date   TONSILLECTOMY     TUBAL LIGATION      Social History   Tobacco Use   Smoking status: Never   Smokeless tobacco: Never  Vaping Use   Vaping status: Never Used  Substance Use Topics   Alcohol use: Not Currently   Drug use: Never    Family History  Problem Relation Age of Onset   Hypertension Mother    Hyperlipidemia Mother    Colon cancer Maternal Uncle    Diabetes Maternal Grandmother    Cancer Maternal Grandmother    Leukemia Maternal Grandmother    Diabetes Maternal Grandfather    Skin cancer Maternal Grandfather    Breast cancer Neg Hx    Ovarian cancer Neg Hx         02/21/2023    9:11 AM 01/23/2023    4:32 PM 12/26/2022    2:57 PM 12/04/2022   10:54 AM  GAD 7 : Generalized Anxiety Score  Nervous, Anxious, on Edge 2 0 1 3  Control/stop worrying 1 0 1 2  Worry  too much - different things 2 0 2 2  Trouble relaxing 1 1 1 1   Restless 2 1 1  0  Easily annoyed or irritable 2 1 1 1   Afraid - awful might happen 0 0 1 2  Total GAD 7 Score 10 3 8 11   Anxiety Difficulty Somewhat difficult  Not difficult at all Somewhat difficult       02/21/2023    9:11 AM 01/23/2023    4:33 PM 12/26/2022    2:57 PM  Depression screen PHQ 2/9  Decreased Interest 1 0 1  Down, Depressed, Hopeless 1 0 1  PHQ - 2 Score 2 0 2  Altered sleeping 2 0 3  Tired, decreased energy 2 0 1  Change in appetite 0 0 0  Feeling bad or failure about yourself  1 0 1  Trouble concentrating 1 0 0  Moving slowly or fidgety/restless 0 0 0  Suicidal thoughts 0 0 0  PHQ-9 Score 8  0 7  Difficult doing work/chores Not difficult at all Not difficult at all Not difficult at all    BP Readings from Last 3 Encounters:  02/21/23 102/66  01/23/23 110/76  12/26/22 108/78    Wt Readings from Last 3 Encounters:  02/21/23 158 lb (71.7 kg)  01/23/23 155 lb (70.3 kg)  12/26/22 160 lb (72.6 kg)    BP 102/66 (BP Location: Right Arm, Patient Position: Sitting, Cuff Size: Normal)   Pulse 77   Temp 98.3 F (36.8 C) (Oral)   Ht 5' 3 (1.6 m)   Wt 158 lb (71.7 kg)   SpO2 98%   BMI 27.99 kg/m   Physical Exam Vitals and nursing note reviewed.  Constitutional:      Appearance: Normal appearance.  HENT:     Nose:     Comments: Mild pressure sensation of the maxillary sinuses without tenderness Cardiovascular:     Rate and Rhythm: Normal rate.  Pulmonary:     Effort: Pulmonary effort is normal.  Abdominal:     General: There is no distension.  Musculoskeletal:        General: Normal range of motion.  Skin:    General: Skin is warm and dry.  Neurological:     Mental Status: She is alert and oriented to person, place, and time.     Gait: Gait is intact.  Psychiatric:        Mood and Affect: Mood and affect normal.     Recent Labs     Component Value Date/Time   NA 141 12/26/2022 1527   K 4.3 12/26/2022 1527   CL 103 12/26/2022 1527   CO2 25 12/26/2022 1527   GLUCOSE 95 12/26/2022 1527   BUN 7 12/26/2022 1527   CREATININE 0.65 12/26/2022 1527   CALCIUM 9.6 12/26/2022 1527   PROT 6.9 12/26/2022 1527   ALBUMIN 4.6 12/26/2022 1527   AST 15 12/26/2022 1527   ALT 12 12/26/2022 1527   ALKPHOS 67 12/26/2022 1527   BILITOT 0.4 12/26/2022 1527    Lab Results  Component Value Date   WBC 6.0 12/26/2022   HGB 13.5 12/26/2022   HCT 40.1 12/26/2022   MCV 94 12/26/2022   PLT 320 12/26/2022   No results found for: HGBA1C No results found for: CHOL, HDL, LDLCALC, LDLDIRECT, TRIG, CHOLHDL Lab Results  Component Value Date   TSH 1.080 12/26/2022      Assessment and Plan:  1. Moderate episode of recurrent major depressive disorder (HCC) (Primary) Refill fluoxetine   with dose increase as below - FLUoxetine  (PROZAC ) 20 MG tablet; Take 1 tablet (20 mg total) by mouth daily.  Dispense: 90 tablet; Refill: 1  2. Anxiety Plan as above and refill hydroxyzine  for as needed use - hydrOXYzine  (ATARAX ) 25 MG tablet; Take 1 tablet (25 mg total) by mouth 3 (three) times daily as needed for anxiety. May cause sedation  Dispense: 90 tablet; Refill: 1  3. Rhinitis, unspecified type Patient given sample of Xyzal  to use once nightly for allergies and sleep.  While trialing this medication, she has been advised to pause hydroxyzine . - levocetirizine (XYZAL  ALLERGY 24HR) 5 MG tablet; Take 1 tablet (5 mg total) by mouth every evening.  Dispense: 5 tablet   Return in about 3 months (around 05/22/2023) for OV f/u anx.    Rolan Hoyle, PA-C, DMSc, Nutritionist [facility] Sports [facility] [phone]

## 2023-04-08 HISTORY — PX: LAPAROSCOPIC TOTAL HYSTERECTOMY: SUR800

## 2023-05-22 ENCOUNTER — Ambulatory Visit: Payer: Self-pay | Admitting: Physician Assistant

## 2023-06-04 ENCOUNTER — Ambulatory Visit (INDEPENDENT_AMBULATORY_CARE_PROVIDER_SITE_OTHER): Admitting: Physician Assistant

## 2023-06-04 ENCOUNTER — Encounter: Payer: Self-pay | Admitting: Physician Assistant

## 2023-06-04 ENCOUNTER — Other Ambulatory Visit: Payer: Self-pay | Admitting: Physician Assistant

## 2023-06-04 VITALS — BP 110/78 | HR 65 | Temp 98.2°F | Ht 63.0 in | Wt 166.0 lb

## 2023-06-04 DIAGNOSIS — Z9889 Other specified postprocedural states: Secondary | ICD-10-CM | POA: Diagnosis not present

## 2023-06-04 DIAGNOSIS — F330 Major depressive disorder, recurrent, mild: Secondary | ICD-10-CM | POA: Diagnosis not present

## 2023-06-04 DIAGNOSIS — G43909 Migraine, unspecified, not intractable, without status migrainosus: Secondary | ICD-10-CM | POA: Insufficient documentation

## 2023-06-04 DIAGNOSIS — Z862 Personal history of diseases of the blood and blood-forming organs and certain disorders involving the immune mechanism: Secondary | ICD-10-CM | POA: Diagnosis not present

## 2023-06-04 DIAGNOSIS — F419 Anxiety disorder, unspecified: Secondary | ICD-10-CM | POA: Diagnosis not present

## 2023-06-04 MED ORDER — HYDROXYZINE HCL 25 MG PO TABS: 25.0000 mg | ORAL_TABLET | Freq: Three times a day (TID) | ORAL | 1 refills | Status: DC | PRN

## 2023-06-04 MED ORDER — NURTEC 75 MG PO TBDP: 1.0000 | ORAL_TABLET | Freq: Every day | ORAL | 2 refills | Status: DC | PRN

## 2023-06-04 MED ORDER — FLUOXETINE HCL 20 MG PO TABS: 20.0000 mg | ORAL_TABLET | Freq: Every day | ORAL | 1 refills | Status: DC

## 2023-06-04 NOTE — Assessment & Plan Note (Signed)
Stable ?Continue fluoxetine and hydroxyzine ?

## 2023-06-04 NOTE — Progress Notes (Signed)
 Date:  06/04/2023   Name:  Teresa Sutton   DOB:  [birth year]   MRN:  [id number]   Chief Complaint: Anxiety  HPI Teresa Sutton presents for 39-month follow-up on anxiety, presently using daily fluoxetine and as needed hydroxyzine, which she takes most nights for sleep.  She recently underwent laparoscopic total hysterectomy with bilateral salpingectomy for abnormal uterine bleeding with [facility] in February 2025, completed postop with them a few weeks ago and was given a good report.  She is not sure if they took her ovaries, but according to the surgical notes it seems that they were not removed.  She has been feeling more emotional recently, sensitive.  States they did not do any postop labs but she is curious about her hormone levels and wonders if they should be checked. Due to family history of cancer, her GYN suggested that if any hormonal therapy is desired in the future, she should seek care through [facility] for careful monitoring.   Overall doing fairly well. Weight is up some but she fluctuates quite a bit historically, up to 20 lbs in a given year. Still remains active in the gym and reports she is "toning".   Was given a sample of Nurtec in Dec and would like me to prescribe this for her.   Medication list has been reviewed and updated.  Current Meds  Medication Sig   albuterol (PROVENTIL HFA;VENTOLIN HFA) 108 (90 Base) MCG/ACT inhaler Inhale 2 puffs into the lungs every 4 (four) hours as needed.   butalbital-acetaminophen-caffeine (FIORICET) 50-325-40 MG tablet Take 1 tablet by mouth every 6 (six) hours as needed for headache.   levocetirizine (XYZAL ALLERGY 24HR) 5 MG tablet Take 1 tablet (5 mg total) by mouth every evening.   promethazine (PHENERGAN) 25 MG tablet Take 0.5-1 tablets (12.5-25 mg total) by mouth every 6 (six) hours as needed for nausea or vomiting.   pseudoephedrine (SUDAFED) 120 MG 12 hr tablet Take 120 mg by mouth every 12 (twelve) hours as needed for congestion.    Rimegepant Sulfate (NURTEC) 75 MG TBDP Take 1 tablet (75 mg total) by mouth daily as needed.   [DISCONTINUED] FLUoxetine (PROZAC) 20 MG tablet Take 1 tablet (20 mg total) by mouth daily.   [DISCONTINUED] hydrOXYzine (ATARAX) 25 MG tablet Take 1 tablet (25 mg total) by mouth 3 (three) times daily as needed for anxiety. May cause sedation   [DISCONTINUED] Norethindrone Acetate-Ethinyl Estradiol (LOESTRIN) 1.5-30 MG-MCG tablet Take by mouth.     Review of Systems  Patient Active Problem List   Diagnosis Date Noted   Chronic midline low back pain with left-sided sciatica 12/26/2022   Anxiety 11/25/2022   Moderate episode of recurrent major depressive disorder (HCC) 11/25/2022    Allergies  Allergen Reactions   Ambien [Zolpidem] Other (See Comments)    Homicidal ideation   Azithromycin Other (See Comments) and Rash    Immunization History  Administered Date(s) Administered   PPD Test 12/21/2020    Past Surgical History:  Procedure Laterality Date   TONSILLECTOMY     TUBAL LIGATION      Social History   Tobacco Use   Smoking status: Never   Smokeless tobacco: Never  Vaping Use   Vaping status: Never Used  Substance Use Topics   Alcohol use: Not Currently   Drug use: Never    Family History  Problem Relation Age of Onset   Hypertension Mother    Hyperlipidemia Mother    Colon cancer Maternal Uncle  Diabetes Maternal Grandmother    Cancer Maternal Grandmother    Leukemia Maternal Grandmother    Diabetes Maternal Grandfather    Skin cancer Maternal Grandfather    Breast cancer Neg Hx    Ovarian cancer Neg Hx         06/04/2023    8:25 AM 02/21/2023    9:11 AM 01/23/2023    4:32 PM 12/26/2022    2:57 PM  GAD 7 : Generalized Anxiety Score  Nervous, Anxious, on Edge 1 2 0 1  Control/stop worrying 1 1 0 1  Worry too much - different things 1 2 0 2  Trouble relaxing 1 1 1 1   Restless 2 2 1 1   Easily annoyed or irritable 1 2 1 1   Afraid - awful might happen 1 0  0 1  Total GAD 7 Score 8 10 3 8   Anxiety Difficulty Not difficult at all Somewhat difficult  Not difficult at all       06/04/2023    8:24 AM 02/21/2023    9:11 AM 01/23/2023    4:33 PM  Depression screen PHQ 2/9  Decreased Interest 1 1 0  Down, Depressed, Hopeless 1 1 0  PHQ - 2 Score 2 2 0  Altered sleeping 2 2 0  Tired, decreased energy 0 2 0  Change in appetite 0 0 0  Feeling bad or failure about yourself  1 1 0  Trouble concentrating 1 1 0  Moving slowly or fidgety/restless 0 0 0  Suicidal thoughts 0 0 0  PHQ-9 Score 6 8 0  Difficult doing work/chores Somewhat difficult Not difficult at all Not difficult at all    BP Readings from Last 3 Encounters:  06/04/23 110/78  02/21/23 102/66  01/23/23 110/76    Wt Readings from Last 3 Encounters:  06/04/23 166 lb (75.3 kg)  02/21/23 158 lb (71.7 kg)  01/23/23 155 lb (70.3 kg)    BP 110/78   Pulse 65   Temp 98.2 F (36.8 C)   Ht 5\' 3"  (1.6 m)   Wt 166 lb (75.3 kg)   LMP 01/10/2022 (Approximate)   SpO2 97%   BMI 29.41 kg/m   Physical Exam Vitals and nursing note reviewed.  Constitutional:      Appearance: Normal appearance.  Cardiovascular:     Rate and Rhythm: Normal rate.  Pulmonary:     Effort: Pulmonary effort is normal.  Abdominal:     General: There is no distension.  Musculoskeletal:        General: Normal range of motion.  Skin:    General: Skin is warm and dry.  Neurological:     Mental Status: She is alert and oriented to person, place, and time.     Gait: Gait is intact.  Psychiatric:        Mood and Affect: Mood and affect normal.     Recent Labs     Component Value Date/Time   NA 141 12/26/2022 1527   K 4.3 12/26/2022 1527   CL 103 12/26/2022 1527   CO2 25 12/26/2022 1527   GLUCOSE 95 12/26/2022 1527   BUN 7 12/26/2022 1527   CREATININE 0.65 12/26/2022 1527   CALCIUM 9.6 12/26/2022 1527   PROT 6.9 12/26/2022 1527   ALBUMIN 4.6 12/26/2022 1527   AST 15 12/26/2022 1527   ALT 12  12/26/2022 1527   ALKPHOS 67 12/26/2022 1527   BILITOT 0.4 12/26/2022 1527    Lab Results  Component Value Date  WBC 6.0 12/26/2022   HGB 13.5 12/26/2022   HCT 40.1 12/26/2022   MCV 94 12/26/2022   PLT 320 12/26/2022   No results found for: "HGBA1C" No results found for: "CHOL", "HDL", "LDLCALC", "LDLDIRECT", "TRIG", "CHOLHDL" Lab Results  Component Value Date   TSH 1.080 12/26/2022     Assessment and Plan:  1. Anxiety (Primary) Stable.  Continue fluoxetine and hydroxyzine. - hydrOXYzine (ATARAX) 25 MG tablet; Take 1 tablet (25 mg total) by mouth 3 (three) times daily as needed for anxiety. May cause sedation  Dispense: 90 tablet; Refill: 1  2. Mild episode of recurrent major depressive disorder (HCC) Improved.  Continue daily fluoxetine as prescribed.   - FLUoxetine (PROZAC) 20 MG tablet; Take 1 tablet (20 mg total) by mouth daily.  Dispense: 90 tablet; Refill: 1  3. Post-operative state Seems to be doing well postoperatively.  Check CBC today given her history of anemia. - CBC with Differential/Platelet  4. History of anemia - CBC with Differential/Platelet  5. Migraine without status migrainosus, not intractable, unspecified migraine type Patient given another sample of Nurtec (2 tablets).  Also sent prescription, though this may need prior authorization.  To switch to sweet - Rimegepant Sulfate (NURTEC) 75 MG TBDP; Take 1 tablet (75 mg total) by mouth daily as needed.  Dispense: 8 tablet; Refill: 2     Return in about 4 months (around 10/04/2023) for OV f/u chronic conditions.    Cody Das, PA-C, DMSc, Nutritionist [facility] and Sports [facility] [phone]

## 2023-06-04 NOTE — Assessment & Plan Note (Signed)
 Improved.  Continue daily fluoxetine as prescribed.

## 2023-06-05 ENCOUNTER — Other Ambulatory Visit: Payer: Self-pay

## 2023-06-05 ENCOUNTER — Telehealth: Payer: Self-pay

## 2023-06-05 LAB — CBC WITH DIFFERENTIAL/PLATELET
Basophils Absolute: 0 10*3/uL (ref 0.0–0.2)
Basos: 1 %
EOS (ABSOLUTE): 0.2 10*3/uL (ref 0.0–0.4)
Eos: 3 %
Hematocrit: 42.4 % (ref 34.0–46.6)
Hemoglobin: 14.2 g/dL (ref 11.1–15.9)
Immature Grans (Abs): 0 10*3/uL (ref 0.0–0.1)
Immature Granulocytes: 0 %
Lymphocytes Absolute: 1.1 10*3/uL (ref 0.7–3.1)
Lymphs: 23 %
MCH: 30.7 pg (ref 26.6–33.0)
MCHC: 33.5 g/dL (ref 31.5–35.7)
MCV: 92 fL (ref 79–97)
Monocytes Absolute: 0.3 10*3/uL (ref 0.1–0.9)
Monocytes: 7 %
Neutrophils Absolute: 3.2 10*3/uL (ref 1.4–7.0)
Neutrophils: 66 %
Platelets: 340 10*3/uL (ref 150–450)
RBC: 4.62 x10E6/uL (ref 3.77–5.28)
RDW: 11.6 % — ABNORMAL LOW (ref 11.7–15.4)
WBC: 4.8 10*3/uL (ref 3.4–10.8)

## 2023-06-05 MED ORDER — FLUOXETINE HCL 20 MG PO CAPS: 20.0000 mg | ORAL_CAPSULE | Freq: Every day | ORAL | 1 refills | Status: DC

## 2023-06-05 NOTE — Telephone Encounter (Signed)
 PA completed waiting on insurance approval.  Key: [id number]  KP

## 2023-06-05 NOTE — Telephone Encounter (Signed)
 Requested medication (s) are due for refill today - no  Requested medication (s) are on the active medication list -yes  Future visit scheduled -yes  Last refill: 06/04/23  Notes to clinic: Pharmacy comment: EPISODES PER MONTH?  Rx missing information- please review   Requested Prescriptions  Pending Prescriptions Disp Refills   NURTEC 75 MG TBDP [Pharmacy Med Name: NURTEC ODT 75 MG TAB[$]]       Off-Protocol Failed - 06/05/2023  8:56 AM      Failed - Medication not assigned to a protocol, review manually.      Failed - Valid encounter within last 12 months    Recent Outpatient Visits           Yesterday Anxiety   [facility] at [facility], Arleen Lacer, [place]       Future Appointments             In 3 months Larkin Plumb, Arleen Lacer, PA [facility] at [facility], [facility]               Requested Prescriptions  Pending Prescriptions Disp Refills   NURTEC 75 MG TBDP [Pharmacy Med Name: NURTEC ODT 75 MG TAB[$]]       Off-Protocol Failed - 06/05/2023  8:56 AM      Failed - Medication not assigned to a protocol, review manually.      Failed - Valid encounter within last 12 months    Recent Outpatient Visits           Yesterday Anxiety   [facility] at [facility], Arleen Lacer, [place]       Future Appointments             In 3 months Larkin Plumb, Arleen Lacer, PA [facility] at [facility], [facility]

## 2023-06-05 NOTE — Telephone Encounter (Signed)
 Sent in capsules. KP

## 2023-06-06 ENCOUNTER — Other Ambulatory Visit: Payer: Self-pay

## 2023-06-06 DIAGNOSIS — G43909 Migraine, unspecified, not intractable, without status migrainosus: Secondary | ICD-10-CM

## 2023-06-06 MED ORDER — NURTEC 75 MG PO TBDP: 1.0000 | ORAL_TABLET | Freq: Every day | ORAL | 2 refills | Status: DC | PRN

## 2023-06-11 ENCOUNTER — Telehealth: Payer: Self-pay

## 2023-06-11 NOTE — Telephone Encounter (Signed)
 PA completed waiting on insurance approval.  Key: Teresa Sutton  KP

## 2023-06-12 NOTE — Telephone Encounter (Signed)
The denial was based on our criteria for Nurtec Tab 75mg  Odt.  Per your health plan's criteria, this drug is covered if you meet the following:  (1) One of the following:  (A) You have tried two triptans (for example: eletriptan, rizatriptan, sumatriptan).  (B) You cannot use all triptans.  (2) If you have four or more headache days per month, one of the following:  (A) You are currently being treated with one preventive treatment for migraine from the following:  (I) Elavil (amitriptyline) or Effexor (venlafaxine).  (II) Depakote/Depakote ER (divalproex sodium) or Topamax (topiramate).  (III) Atenolol, metoprolol, nadolol, propranolol, or timolol.  (IV) Candesartan.  (V) Lisinopril.  (B) You have failed (after at least a two month trial) or cannot use one of the following preventive  treatments for migraine:  (I) Elavil (amitriptyline) or Effexor (venlafaxine).  (II) Depakote/Depakote ER (divalproex sodium) or Topamax (topiramate).  (III) Atenolol, metoprolol, nadolol, propranolol, or timolol.  (IV) Candesartan.  (V) Lisinopril.

## 2023-06-17 ENCOUNTER — Ambulatory Visit: Payer: Self-pay

## 2023-06-17 NOTE — Telephone Encounter (Signed)
  Chief Complaint: headache, positive covid Symptoms: hot flashes, fatigue, cough, post nasal drip, SOB with exertion Frequency: today Pertinent Negatives: Patient denies fever,  Disposition: [] ED /[] Urgent Care (no appt availability in office) / [x] Appointment(In office/virtual)/ []  [facility]/ [] Home Care/ [] Refused Recommended Disposition /[] [facility]/ []  Follow-up with PCP Additional Notes: wanting Paxlovid  Copied from CRM [id number]. Topic: Clinical - Red Word Triage >> Jun 17, 2023  4:17 PM Rosaria Common wrote: Red Word that prompted transfer to Nurse Triage: Pt tested positive for covid-19 today and is exeperiencing painful headaches, and hot flashes etc Reason for Disposition  [1] COVID-19 diagnosed by positive lab test (e.g., PCR, rapid self-test kit) AND [2] mild symptoms (e.g., cough, fever, others) AND [3] no complications or SOB  Answer Assessment - Initial Assessment Questions 1. COVID-19 DIAGNOSIS: "How do you know that you have COVID?" (e.g., positive lab test or self-test, diagnosed by doctor or NP/PA, symptoms after exposure).     Self test  2. COVID-19 EXPOSURE: "Was there any known exposure to COVID before the symptoms began?" CDC Definition of close contact: within 6 feet (2 meters) for a total of 15 minutes or more over a 24-hour period.      Yes school 3. ONSET: "When did the COVID-19 symptoms start?"      Last night  4. WORST SYMPTOM: "What is your worst symptom?" (e.g., cough, fever, shortness of breath, muscle aches)     Headache, hot flashes, working harder to breath 5. COUGH: "Do you have a cough?" If Yes, ask: "How bad is the cough?"       yes 6. FEVER: "Do you have a fever?" If Yes, ask: "What is your temperature, how was it measured, and when did it start?"     Skin feels hot  7. RESPIRATORY STATUS: "Describe your breathing?" (e.g., normal; shortness of breath, wheezing, unable to speak)      Wheezing, SOB with exertion  8.  BETTER-SAME-WORSE: "Are you getting better, staying the same or getting worse compared to yesterday?"  If getting worse, ask, "In what way?"     worse 9. OTHER SYMPTOMS: "Do you have any other symptoms?"  (e.g., chills, fatigue, headache, loss of smell or taste, muscle pain, sore throat)     Runny nose, post nasal drip , fatigue 10. HIGH RISK DISEASE: "Do you have any chronic medical problems?" (e.g., asthma, heart or lung disease, weak immune system, obesity, etc.)       Asthma  13. O2 SATURATION MONITOR:  "Do you use an oxygen saturation monitor (pulse oximeter) at home?" If Yes, ask "What is your reading (oxygen level) today?" "What is your usual oxygen saturation reading?" (e.g., 95%)       96 no O2  Protocols used: Coronavirus (COVID-19) Diagnosed or Suspected-A-AH

## 2023-06-18 ENCOUNTER — Encounter: Payer: Self-pay | Admitting: Physician Assistant

## 2023-06-18 ENCOUNTER — Telehealth (INDEPENDENT_AMBULATORY_CARE_PROVIDER_SITE_OTHER): Admitting: Physician Assistant

## 2023-06-18 VITALS — Ht 63.0 in

## 2023-06-18 DIAGNOSIS — U071 COVID-19: Secondary | ICD-10-CM | POA: Diagnosis not present

## 2023-06-18 DIAGNOSIS — J4520 Mild intermittent asthma, uncomplicated: Secondary | ICD-10-CM | POA: Insufficient documentation

## 2023-06-18 MED ORDER — NIRMATRELVIR/RITONAVIR (PAXLOVID)TABLET: 3.0000 | ORAL_TABLET | Freq: Two times a day (BID) | ORAL | 0 refills | Status: AC

## 2023-06-18 NOTE — Progress Notes (Signed)
 Date:  06/18/2023   Name:  Teresa Sutton   DOB:  [birth year]   MRN:  [id number]   I connected with Budd Cargo on 06/18/23 via MyChart Video and verified that I am speaking with the correct Andriy Sherk using appropriate identifiers. The limitations, risks, security and privacy concerns of performing an evaluation and management service by MyChart Video, including the higher likelihood of inaccurate diagnoses and treatments, and the availability of in [facility] appointments were reviewed. The possible need of an additional face-to-face encounter for complete and high quality delivery of care was discussed. The patient was also made aware that there may be a patient responsible charge related to this service. The patient expressed understanding and wishes to proceed.   Provider location is in medical facility [facility] at [facility]). Patient location is at their home People involved in care of the patient during this telehealth encounter were myself, my CMA, and my front office/scheduling team member.    Chief Complaint: Covid Positive (X2 days, tested positive yesterday,Congestion, fever, headache, hot flashes,cough, took tylenol for fever)  HPI Shatavia presents for virtual visit today for acute COVID-19 with positive home test yesterday, symptoms started Monday night (roughly 36 hours ago).  She is using Tylenol and Delsym for symptom control, but without much relief.  Given her history of asthma she is interested in antiviral therapy with Paxlovid and hoping that I will prescribe this today.  She was previously treated with molnupiravir  for COVID-19 infection in December 2023 which was free/low-cost.   Medication list has been reviewed and updated.  Current Meds  Medication Sig   albuterol  (PROVENTIL  HFA;VENTOLIN  HFA) 108 (90 Base) MCG/ACT inhaler Inhale 2 puffs into the lungs every 4 (four) hours as needed.    butalbital -acetaminophen-caffeine  (FIORICET) 50-325-40 MG tablet Take 1 tablet by mouth every 6 (six) hours as needed for headache.   FLUoxetine  (PROZAC ) 20 MG capsule Take 1 capsule (20 mg total) by mouth daily.   hydrOXYzine  (ATARAX ) 25 MG tablet Take 1 tablet (25 mg total) by mouth 3 (three) times daily as needed for anxiety. May cause sedation   levocetirizine (XYZAL  ALLERGY 24HR) 5 MG tablet Take 1 tablet (5 mg total) by mouth every evening.   nirmatrelvir/ritonavir (PAXLOVID) 20 x 150 MG & 10 x 100MG  TABS Take 3 tablets by mouth 2 (two) times daily for 5 days. (Take nirmatrelvir 150 mg two tablets twice daily for 5 days and ritonavir 100 mg one tablet twice daily for 5 days) Patient GFR is 118   promethazine  (PHENERGAN ) 25 MG tablet Take 0.5-1 tablets (12.5-25 mg total) by mouth every 6 (six) hours as needed for nausea or vomiting.   pseudoephedrine (SUDAFED) 120 MG 12 hr tablet Take 120 mg by mouth every 12 (twelve) hours as needed for congestion.   Rimegepant Sulfate (NURTEC) 75 MG TBDP Take 1 tablet (75 mg total) by mouth daily as needed.     Review of Systems  Patient Active Problem List   Diagnosis Date Noted   Migraine without status migrainosus, not intractable 06/04/2023   Chronic midline low back pain with left-sided sciatica 12/26/2022   Anxiety 11/25/2022   Moderate episode of recurrent major depressive disorder (HCC) 11/25/2022    Allergies  Allergen Reactions   Ambien [Zolpidem] Other (See Comments)    Homicidal ideation   Azithromycin Other (See Comments) and Rash    Immunization History  Administered Date(s) Administered   PPD Test 12/21/2020  Past Surgical History:  Procedure Laterality Date   LAPAROSCOPIC TOTAL HYSTERECTOMY  04/08/2023   w/ bilateral salpingectomy   TONSILLECTOMY     TUBAL LIGATION      Social History   Tobacco Use   Smoking status: Never   Smokeless tobacco: Never  Vaping Use   Vaping status: Never Used  Substance Use Topics    Alcohol use: Not Currently   Drug use: Never    Family History  Problem Relation Age of Onset   Hypertension Mother    Hyperlipidemia Mother    Colon cancer Maternal Uncle    Diabetes Maternal Grandmother    Cancer Maternal Grandmother    Leukemia Maternal Grandmother    Diabetes Maternal Grandfather    Skin cancer Maternal Grandfather    Breast cancer Neg Hx    Ovarian cancer Neg Hx         06/04/2023    8:25 AM 02/21/2023    9:11 AM 01/23/2023    4:32 PM 12/26/2022    2:57 PM  GAD 7 : Generalized Anxiety Score  Nervous, Anxious, on Edge 1 2 0 1  Control/stop worrying 1 1 0 1  Worry too much - different things 1 2 0 2  Trouble relaxing 1 1 1 1   Restless 2 2 1 1   Easily annoyed or irritable 1 2 1 1   Afraid - awful might happen 1 0 0 1  Total GAD 7 Score 8 10 3 8   Anxiety Difficulty Not difficult at all Somewhat difficult  Not difficult at all       06/04/2023    8:24 AM 02/21/2023    9:11 AM 01/23/2023    4:33 PM  Depression screen PHQ 2/9  Decreased Interest 1 1 0  Down, Depressed, Hopeless 1 1 0  PHQ - 2 Score 2 2 0  Altered sleeping 2 2 0  Tired, decreased energy 0 2 0  Change in appetite 0 0 0  Feeling bad or failure about yourself  1 1 0  Trouble concentrating 1 1 0  Moving slowly or fidgety/restless 0 0 0  Suicidal thoughts 0 0 0  PHQ-9 Score 6 8 0  Difficult doing work/chores Somewhat difficult Not difficult at all Not difficult at all    BP Readings from Last 3 Encounters:  06/04/23 110/78  02/21/23 102/66  01/23/23 110/76    Wt Readings from Last 3 Encounters:  06/04/23 166 lb (75.3 kg)  02/21/23 158 lb (71.7 kg)  01/23/23 155 lb (70.3 kg)    Ht 5\' 3"  (1.6 m)   LMP 01/10/2022 (Approximate)   BMI 29.41 kg/m   Physical Exam General: Patient appears unwell, but not critically ill.  Speaking full sentences, no audible heavy breathing. Sounds alert and appropriately interactive. Face symmetric. Extraocular movements intact. Pupils equal and  round. No nasal flaring or accessory muscle use visualized.  Recent Labs     Component Value Date/Time   NA 141 12/26/2022 1527   K 4.3 12/26/2022 1527   CL 103 12/26/2022 1527   CO2 25 12/26/2022 1527   GLUCOSE 95 12/26/2022 1527   BUN 7 12/26/2022 1527   CREATININE 0.65 12/26/2022 1527   CALCIUM 9.6 12/26/2022 1527   PROT 6.9 12/26/2022 1527   ALBUMIN 4.6 12/26/2022 1527   AST 15 12/26/2022 1527   ALT 12 12/26/2022 1527   ALKPHOS 67 12/26/2022 1527   BILITOT 0.4 12/26/2022 1527    Lab Results  Component Value Date   WBC  4.8 06/04/2023   HGB 14.2 06/04/2023   HCT 42.4 06/04/2023   MCV 92 06/04/2023   PLT 340 06/04/2023   No results found for: "HGBA1C" No results found for: "CHOL", "HDL", "LDLCALC", "LDLDIRECT", "TRIG", "CHOLHDL" Lab Results  Component Value Date   TSH 1.080 12/26/2022     Assessment and Plan:  1. Acute COVID-19 (Primary) Prescribing Paxlovid for acute COVID-19 infection in the context of asthma.    Also advised conservative measures including rest, fluids, honey, and OTC cough/cold medications.   Contact precautions advised to limit spread. Encouraged mask wearing and good hand hygiene especially before meals. Call if acutely worsening symptoms or if no improvement after Day 7 of illness.  Out of work until next Monday, 06/23/2023  - nirmatrelvir/ritonavir (PAXLOVID) 20 x 150 MG & 10 x 100MG  TABS; Take 3 tablets by mouth 2 (two) times daily for 5 days. (Take nirmatrelvir 150 mg two tablets twice daily for 5 days and ritonavir 100 mg one tablet twice daily for 5 days) Patient GFR is 118  Dispense: 30 tablet; Refill: 0    I discussed the above assessment and treatment plan with the patient. The patient was provided an opportunity to ask questions and all were answered. The patient agreed with the plan and demonstrated an understanding of the instructions. The patient was advised to call back or seek an in-[facility] evaluation if the symptoms worsen or if  the condition fails to improve as anticipated. I provided a total time of 15 minutes inclusive of time utilized for medical chart review, information gathering, care coordination with staff, and documentation completion.  Cody Das, PA-C, DMSc, Nutritionist [facility] [phone]

## 2023-06-18 NOTE — Telephone Encounter (Signed)
 Noted  Pt has a appt.  KP

## 2023-06-18 NOTE — Telephone Encounter (Signed)
 FYI  KP

## 2023-06-24 NOTE — Telephone Encounter (Signed)
 Please review and advise.   JM

## 2023-06-24 NOTE — Telephone Encounter (Signed)
 Please review.  KP

## 2023-07-28 ENCOUNTER — Ambulatory Visit: Payer: Self-pay

## 2023-07-28 NOTE — Telephone Encounter (Signed)
 FYI Only or Action Required?: Action required by provider  Patient was last seen in primary care on 06/18/2023 by Leopoldo Rancher, PA. Called Nurse Triage reporting Cough. Symptoms began 2.5 weeks ago. Interventions attempted: Rest, hydration, or home remedies. Symptoms are: unchanged.  Triage Disposition: See PCP When Office is Open (Within 3 Days)  Patient/caregiver understands and will follow disposition?: Yes        Copied from CRM [id number]. Topic: Clinical - Red Word Triage >> Jul 28, 2023  7:58 AM Teresa Sutton wrote: Red Word that prompted transfer to Nurse Triage: Patient is calling about cough that makes her loose her breath she has asthma Reason for Disposition  [1] Patient also has allergy symptoms (e.g., itchy eyes, clear nasal discharge, postnasal drip) AND [2] they are acting up  Answer Assessment - Initial Assessment Questions 1. ONSET: "When did the cough begin?"      2.5 weeks 2. SEVERITY: "How bad is the cough today?"      Post nasal drip causes her to cough and patient has asthma 3. SPUTUM: "Describe the color of your sputum" (none, dry cough; clear, white, yellow, green)     N/a 4. HEMOPTYSIS: "Are you coughing up any blood?" If so ask: "How much?" (flecks, streaks, tablespoons, etc.)     No 5. DIFFICULTY BREATHING: "Are you having difficulty breathing?" If Yes, ask: "How bad is it?" (e.g., mild, moderate, severe)    - MILD: No SOB at rest, mild SOB with walking, speaks normally in sentences, can lie down, no retractions, pulse < 100.    - MODERATE: SOB at rest, SOB with minimal exertion and prefers to sit, cannot lie down flat, speaks in phrases, mild retractions, audible wheezing, pulse 100-120.    - SEVERE: Very SOB at rest, speaks in single words, struggling to breathe, sitting hunched forward, retractions, pulse > 120      No 6. FEVER: "Do you have a fever?" If Yes, ask: "What is your temperature, how was it measured, and when did it start?"     No 7. CARDIAC  HISTORY: "Do you have any history of heart disease?" (e.g., heart attack, congestive heart failure)      No 8. LUNG HISTORY: "Do you have any history of lung disease?"  (e.g., pulmonary embolus, asthma, emphysema)     Asthma 9. PE RISK FACTORS: "Do you have a history of blood clots?" (or: recent major surgery, recent prolonged travel, bedridden)     No 10. OTHER SYMPTOMS: "Do you have any other symptoms?" (e.g., runny nose, wheezing, chest pain)       Post nasal drip 11. PREGNANCY: "Is there any chance you are pregnant?" "When was your last menstrual period?"       No--hysterectomy 12. TRAVEL: "Have you traveled out of the country in the last month?" (e.g., travel history, exposures)       No   Patient has an appointment this Thursday 07/31/2023 at 2:20 PM with her PCP that she originally scheduled for this cough and she called to add on a complain of her elbow flaring up from previous issues---so she wanted to see if she could have this cough assessed at the same time as well.  She states that if she needs a different appointment--someone can contact her and she will set that up but she would like to have everything checked out in one visit if possible. She is advised that if anything gets worse to go to the Emergency Room and she verbalized  understanding of this.  Protocols used: Cough - Acute Non-Productive-A-AH

## 2023-07-31 ENCOUNTER — Encounter: Payer: Self-pay | Admitting: Physician Assistant

## 2023-07-31 ENCOUNTER — Ambulatory Visit: Admitting: Physician Assistant

## 2023-07-31 VITALS — BP 122/74 | HR 73 | Temp 98.9°F | Ht 63.0 in | Wt 167.0 lb

## 2023-07-31 DIAGNOSIS — J4521 Mild intermittent asthma with (acute) exacerbation: Secondary | ICD-10-CM

## 2023-07-31 DIAGNOSIS — M778 Other enthesopathies, not elsewhere classified: Secondary | ICD-10-CM

## 2023-07-31 MED ORDER — PREDNISONE 20 MG PO TABS: 20.0000 mg | ORAL_TABLET | Freq: Two times a day (BID) | ORAL | 0 refills | Status: AC

## 2023-07-31 MED ORDER — DICLOFENAC SODIUM 1 % EX GEL: 2.0000 g | Freq: Four times a day (QID) | CUTANEOUS | 1 refills | Status: DC

## 2023-07-31 MED ORDER — DOXYCYCLINE HYCLATE 100 MG PO TABS: 100.0000 mg | ORAL_TABLET | Freq: Two times a day (BID) | ORAL | 0 refills | Status: AC

## 2023-07-31 NOTE — Progress Notes (Signed)
 Date:  07/31/2023   Name:  Teresa Sutton   DOB:  16-Apr-1985   MRN:  [id number]   Chief Complaint: Elbow Pain (Right, 1.5 months, feels like its in the bone, aching pain, shoots up arm into neck and down to fingers, 3 when not moving, 7 when moving, tried aleve and tylenol, biofreeze and lidocaine nothing helps) and Cough (X 2-3 weeks, dry cough, not other symptoms, had covid a few months ago )  HPI Teresa Sutton returns to clinic today for evaluation of persistent dry cough for the last 2-3 weeks. She has mild intermittent asthma and I treated for presumptive atypical pneumonia last Nov. She says she has no close sick contacts but some kind of pneumonia has been going around at school. She is presently using Xyzal  and Nasacort for allergies.   Separately, she's been having right elbow pain for about 6 weeks. It hurts constantly, but especially with extension. Conservatives measures at home have not helped. She has never used diclofenac gel.    Medication list has been reviewed and updated.  Current Meds  Medication Sig   albuterol  (PROVENTIL  HFA;VENTOLIN  HFA) 108 (90 Base) MCG/ACT inhaler Inhale 2 puffs into the lungs every 4 (four) hours as needed.   butalbital -acetaminophen-caffeine  (FIORICET) 50-325-40 MG tablet Take 1 tablet by mouth every 6 (six) hours as needed for headache.   diclofenac Sodium (VOLTAREN) 1 % GEL Apply 2 g topically 4 (four) times daily. Use on affected joint up to 4x/day as needed. 2 grams is roughly 4 fingertips' worth of gel.   doxycycline  (VIBRA -TABS) 100 MG tablet Take 1 tablet (100 mg total) by mouth 2 (two) times daily for 7 days. Do not take with dairy. This medication INCREASES SUN SENSITIVITY so avoid direct sunlight.   levocetirizine (XYZAL  ALLERGY 24HR) 5 MG tablet Take 1 tablet (5 mg total) by mouth every evening.   predniSONE  (DELTASONE ) 20 MG tablet Take 1 tablet (20 mg total) by mouth 2 (two) times daily with a meal for 5 days.   promethazine   (PHENERGAN ) 25 MG tablet Take 0.5-1 tablets (12.5-25 mg total) by mouth every 6 (six) hours as needed for nausea or vomiting.   pseudoephedrine (SUDAFED) 120 MG 12 hr tablet Take 120 mg by mouth every 12 (twelve) hours as needed for congestion.   Rimegepant Sulfate (NURTEC) 75 MG TBDP Take 1 tablet (75 mg total) by mouth daily as needed.   Triamcinolone Acetonide (NASACORT ALLERGY 24HR NA) Place into the nose.   [DISCONTINUED] hydrOXYzine  (ATARAX ) 25 MG tablet Take 1 tablet (25 mg total) by mouth 3 (three) times daily as needed for anxiety. May cause sedation     Review of Systems  Patient Active Problem List   Diagnosis Date Noted   Mild intermittent asthma 06/18/2023   Migraine without status migrainosus, not intractable 06/04/2023   Chronic midline low back pain with left-sided sciatica 12/26/2022   Anxiety 11/25/2022   Moderate episode of recurrent major depressive disorder (HCC) 11/25/2022    Allergies  Allergen Reactions   Ambien [Zolpidem] Other (See Comments)    Homicidal ideation   Azithromycin Other (See Comments) and Rash    Immunization History  Administered Date(s) Administered   PPD Test 12/21/2020    Past Surgical History:  Procedure Laterality Date   LAPAROSCOPIC TOTAL HYSTERECTOMY  04/08/2023   w/ bilateral salpingectomy   TONSILLECTOMY     TUBAL LIGATION      Social History   Tobacco Use   Smoking status: Never  Smokeless tobacco: Never  Vaping Use   Vaping status: Never Used  Substance Use Topics   Alcohol use: Not Currently   Drug use: Never    Family History  Problem Relation Age of Onset   Hypertension Mother    Hyperlipidemia Mother    Colon cancer Maternal Uncle    Diabetes Maternal Grandmother    Cancer Maternal Grandmother    Leukemia Maternal Grandmother    Diabetes Maternal Grandfather    Skin cancer Maternal Grandfather    Breast cancer Neg Hx    Ovarian cancer Neg Hx         06/04/2023    8:25 AM 02/21/2023    9:11 AM  01/23/2023    4:32 PM 12/26/2022    2:57 PM  GAD 7 : Generalized Anxiety Score  Nervous, Anxious, on Edge 1 2 0 1  Control/stop worrying 1 1 0 1  Worry too much - different things 1 2 0 2  Trouble relaxing 1 1 1 1   Restless 2 2 1 1   Easily annoyed or irritable 1 2 1 1   Afraid - awful might happen 1 0 0 1  Total GAD 7 Score 8 10 3 8   Anxiety Difficulty Not difficult at all Somewhat difficult  Not difficult at all       06/04/2023    8:24 AM 02/21/2023    9:11 AM 01/23/2023    4:33 PM  Depression screen PHQ 2/9  Decreased Interest 1 1 0  Down, Depressed, Hopeless 1 1 0  PHQ - 2 Score 2 2 0  Altered sleeping 2 2 0  Tired, decreased energy 0 2 0  Change in appetite 0 0 0  Feeling bad or failure about yourself  1 1 0  Trouble concentrating 1 1 0  Moving slowly or fidgety/restless 0 0 0  Suicidal thoughts 0 0 0  PHQ-9 Score 6 8 0  Difficult doing work/chores Somewhat difficult Not difficult at all Not difficult at all    BP Readings from Last 3 Encounters:  07/31/23 122/74  06/04/23 110/78  02/21/23 102/66    Wt Readings from Last 3 Encounters:  07/31/23 167 lb (75.8 kg)  06/04/23 166 lb (75.3 kg)  02/21/23 158 lb (71.7 kg)    BP 122/74   Pulse 73   Temp 98.9 F (37.2 C)   Ht 5' 3 (1.6 m)   Wt 167 lb (75.8 kg)   LMP 01/10/2022 (Approximate)   SpO2 97%   BMI 29.58 kg/m   Physical Exam Vitals and nursing note reviewed.  Constitutional:      General: She is not in acute distress.    Appearance: Normal appearance.  HENT:     Right Ear: Tympanic membrane normal.     Left Ear: Tympanic membrane normal.     Ears:     Comments: EAC clear bilaterally with good view of TM which is without effusion or erythema.     Nose: Nose normal.     Comments: Sinuses nontender    Mouth/Throat:     Mouth: Mucous membranes are moist.     Pharynx: No oropharyngeal exudate or posterior oropharyngeal erythema.   Eyes:     Conjunctiva/sclera: Conjunctivae normal.     Pupils:  Pupils are equal, round, and reactive to light.    Cardiovascular:     Rate and Rhythm: Normal rate and regular rhythm.     Heart sounds: No murmur heard.    No friction rub. No gallop.  Pulmonary:     Effort: Pulmonary effort is normal.     Breath sounds: Wheezing present. No rhonchi or rales.   Musculoskeletal:     Comments: Right elbow TTP at the lateral aspect. ROM preserved, but painful in extension and supination. No overlying skin changes, no warmth or edema evident.   Lymphadenopathy:     Cervical: No cervical adenopathy.     Recent Labs     Component Value Date/Time   NA 141 12/26/2022 1527   K 4.3 12/26/2022 1527   CL 103 12/26/2022 1527   CO2 25 12/26/2022 1527   GLUCOSE 95 12/26/2022 1527   BUN 7 12/26/2022 1527   CREATININE 0.65 12/26/2022 1527   CALCIUM 9.6 12/26/2022 1527   PROT 6.9 12/26/2022 1527   ALBUMIN 4.6 12/26/2022 1527   AST 15 12/26/2022 1527   ALT 12 12/26/2022 1527   ALKPHOS 67 12/26/2022 1527   BILITOT 0.4 12/26/2022 1527    Lab Results  Component Value Date   WBC 4.8 06/04/2023   HGB 14.2 06/04/2023   HCT 42.4 06/04/2023   MCV 92 06/04/2023   PLT 340 06/04/2023   No results found for: HGBA1C No results found for: CHOL, HDL, LDLCALC, LDLDIRECT, TRIG, CHOLHDL Lab Results  Component Value Date   TSH 1.080 12/26/2022     Assessment and Plan:  1. Mild intermittent asthma with exacerbation (Primary) Will treat for asthma exacerbation with prednisone  and cover with antibiotic in the event of atypical pneumonia. Use inhaler PRN.   - predniSONE  (DELTASONE ) 20 MG tablet; Take 1 tablet (20 mg total) by mouth 2 (two) times daily with a meal for 5 days.  Dispense: 10 tablet; Refill: 0 - doxycycline  (VIBRA -TABS) 100 MG tablet; Take 1 tablet (100 mg total) by mouth 2 (two) times daily for 7 days. Do not take with dairy. This medication INCREASES SUN SENSITIVITY so avoid direct sunlight.  Dispense: 14 tablet; Refill: 0  2.  Tendonitis of elbow, right Try topical diclofenac. Prednisone  also likely to help quite a bit.   - diclofenac Sodium (VOLTAREN) 1 % GEL; Apply 2 g topically 4 (four) times daily. Use on affected joint up to 4x/day as needed. 2 grams is roughly 4 fingertips' worth of gel.  Dispense: 100 g; Refill: 1     Return in about 2 weeks (around 08/14/2023) for OV f/u cough, elbow pain.    Cody Das, PA-C, DMSc, Nutritionist [facility] [phone]

## 2023-08-05 NOTE — Telephone Encounter (Signed)
 Please review.  EV

## 2023-09-25 ENCOUNTER — Ambulatory Visit: Admitting: Physician Assistant

## 2023-12-16 ENCOUNTER — Telehealth: Admitting: Physician Assistant

## 2023-12-16 VITALS — Ht 63.0 in

## 2023-12-16 DIAGNOSIS — K625 Hemorrhage of anus and rectum: Secondary | ICD-10-CM | POA: Diagnosis not present

## 2023-12-16 NOTE — Progress Notes (Signed)
 Date:  12/16/2023   Name:  Teresa Sutton   DOB:  1985/08/21   MRN:  [id number]   I connected with Almarie Jernigan on 12/16/2023 via MyChart Video and verified that I am speaking with the correct person using appropriate identifiers. The limitations, risks, security and privacy concerns of performing an evaluation and management service by MyChart Video, including the higher likelihood of inaccurate diagnoses and treatments, and the availability of in person appointments were reviewed. The possible need of an additional face-to-face encounter for complete and high quality delivery of care was discussed. The patient was also made aware that there may be a patient responsible charge related to this service. The patient expressed understanding and wishes to proceed.   Provider location is in medical facility [facility] at [facility]). Patient location is at their home People involved in care of the patient during this telehealth encounter were myself, my CMA, and my front office/scheduling team member.    Chief Complaint: Irritable Bowel Syndrome (X7 months, getting worse, Ibs/ibd issues since hysterectomy, and it's gotten worse. Having bleeding pass with stool getting worse, not painful, bright red blood)  HPI   Teresa Sutton presents virtually today desiring referral to [facility] for BRBPR which she noticed since her hysterectomy in February.  She does not pass blood clots in the stool, but reports bright red blood in the water.  No associated abdominal pain, no suspicion for hemorrhoids.  She reports that her bowel movements are fairly regular.  Family history of inflammatory bowel disease in her uncle and her son.  She has never had a colonoscopy.  Medication list has been reviewed and updated.  No outpatient medications have been marked as taking for the 12/16/23 encounter (Telemedicine) with Manya Toribio SQUIBB, PA.     Review of  Systems  Patient Active Problem List   Diagnosis Date Noted   Mild intermittent asthma 06/18/2023   Migraine without status migrainosus, not intractable 06/04/2023   Chronic midline low back pain with left-sided sciatica 12/26/2022   Anxiety 11/25/2022   Moderate episode of recurrent major depressive disorder (HCC) 11/25/2022    Allergies  Allergen Reactions   Ambien [Zolpidem] Other (See Comments)    Homicidal ideation   Azithromycin Other (See Comments) and Rash    Immunization History  Administered Date(s) Administered   Influenza,inj,Quad PF,6+ Mos 11/05/2023   PPD Test 12/21/2020    Past Surgical History:  Procedure Laterality Date   LAPAROSCOPIC TOTAL HYSTERECTOMY  04/08/2023   w/ bilateral salpingectomy   TONSILLECTOMY     TUBAL LIGATION      Social History   Tobacco Use   Smoking status: Never   Smokeless tobacco: Never  Vaping Use   Vaping status: Never Used  Substance Use Topics   Alcohol use: Not Currently   Drug use: Never    Family History  Problem Relation Age of Onset   Hypertension Mother    Hyperlipidemia Mother    Diabetes Maternal Grandmother    Cancer Maternal Grandmother    Leukemia Maternal Grandmother    Diabetes Maternal Grandfather    Skin cancer Maternal Grandfather    Colon cancer Maternal Uncle    Inflammatory bowel disease Maternal Uncle    Ulcerative colitis Son    Breast cancer Neg Hx    Ovarian cancer Neg Hx         12/16/2023   10:14 AM 06/04/2023    8:25 AM 02/21/2023  9:11 AM 01/23/2023    4:32 PM  GAD 7 : Generalized Anxiety Score  Nervous, Anxious, on Edge 0 1 2 0  Control/stop worrying 0 1 1 0  Worry too much - different things 0 1 2 0  Trouble relaxing 0 1 1 1   Restless 0 2 2 1   Easily annoyed or irritable 0 1 2 1   Afraid - awful might happen 0 1 0 0  Total GAD 7 Score 0 8 10 3   Anxiety Difficulty Not difficult at all Not difficult at all Somewhat difficult        12/16/2023   10:14 AM 06/04/2023     8:24 AM 02/21/2023    9:11 AM  Depression screen PHQ 2/9  Decreased Interest 0 1 1  Down, Depressed, Hopeless 0 1 1  PHQ - 2 Score 0 2 2  Altered sleeping 0 2 2  Tired, decreased energy 1 0 2  Change in appetite 0 0 0  Feeling bad or failure about yourself  0 1 1  Trouble concentrating 0 1 1  Moving slowly or fidgety/restless 0 0 0  Suicidal thoughts 0 0 0  PHQ-9 Score 1 6 8   Difficult doing work/chores Not difficult at all Somewhat difficult Not difficult at all    BP Readings from Last 3 Encounters:  07/31/23 122/74  06/04/23 110/78  02/21/23 102/66    Wt Readings from Last 3 Encounters:  07/31/23 167 lb (75.8 kg)  06/04/23 166 lb (75.3 kg)  02/21/23 158 lb (71.7 kg)    Ht 5' 3 (1.6 m)   LMP 01/10/2022 (Approximate)   BMI 29.58 kg/m   Physical Exam General: Speaking full sentences, no audible heavy breathing. Sounds alert and appropriately interactive. Well-appearing. Face symmetric. Extraocular movements intact. Pupils equal and round. No nasal flaring or accessory muscle use visualized.  Recent Labs     Component Value Date/Time   NA 141 12/26/2022 1527   K 4.3 12/26/2022 1527   CL 103 12/26/2022 1527   CO2 25 12/26/2022 1527   GLUCOSE 95 12/26/2022 1527   BUN 7 12/26/2022 1527   CREATININE 0.65 12/26/2022 1527   CALCIUM 9.6 12/26/2022 1527   PROT 6.9 12/26/2022 1527   ALBUMIN 4.6 12/26/2022 1527   AST 15 12/26/2022 1527   ALT 12 12/26/2022 1527   ALKPHOS 67 12/26/2022 1527   BILITOT 0.4 12/26/2022 1527    Lab Results  Component Value Date   WBC 4.8 06/04/2023   HGB 14.2 06/04/2023   HCT 42.4 06/04/2023   MCV 92 06/04/2023   PLT 340 06/04/2023   No results found for: HGBA1C No results found for: CHOL, HDL, LDLCALC, LDLDIRECT, TRIG, CHOLHDL Lab Results  Component Value Date   TSH 1.080 12/26/2022     Assessment and Plan:  1. BRBPR (bright red blood per rectum) (Primary) Referring to [facility] for further evaluation especially  with family history of inflammatory bowel disease.  Patient informed she will likely need a colonoscopy.  She verbalizes understanding and would like to proceed. - Ambulatory referral to Gastroenterology    I discussed the above assessment and treatment plan with the patient. The patient was provided an opportunity to ask questions and all were answered. The patient agreed with the plan and demonstrated an understanding of the instructions. The patient was advised to call back or seek an in-person evaluation if the symptoms worsen or if the condition fails to improve as anticipated. I provided a total time of 21 minutes  inclusive of time utilized for medical chart review, information gathering, care coordination with staff, and documentation completion.  Rolan Hoyle, PA-C, DMSc, Nutritionist [facility] [phone]

## 2023-12-17 ENCOUNTER — Telehealth: Payer: Self-pay

## 2023-12-17 ENCOUNTER — Other Ambulatory Visit: Payer: Self-pay | Admitting: Physician Assistant

## 2023-12-17 DIAGNOSIS — K625 Hemorrhage of anus and rectum: Secondary | ICD-10-CM

## 2023-12-17 NOTE — Telephone Encounter (Signed)
 FYI  KP

## 2023-12-17 NOTE — Telephone Encounter (Signed)
See other conversation

## 2023-12-17 NOTE — Telephone Encounter (Signed)
 Copied from CRM [id number]. Topic: Clinical - Request for Lab/Test Order >> Dec 17, 2023  8:58 AM Deaijah H wrote: Reason for CRM: Tawni w/ [facility]. Called in stating they need a separate referral for colonoscopy. Fax: [phone]

## 2023-12-31 NOTE — Telephone Encounter (Signed)
 Please review. Pt has 2 referrals placed.  KP

## 2023-12-31 NOTE — Telephone Encounter (Signed)
 Please get pt scheduled w/ [facility] for colonoscopy. Referral placed 12/17/23.  KP

## 2024-01-01 ENCOUNTER — Other Ambulatory Visit: Payer: Self-pay | Admitting: Physician Assistant

## 2024-01-01 DIAGNOSIS — K625 Hemorrhage of anus and rectum: Secondary | ICD-10-CM

## 2024-01-01 NOTE — Telephone Encounter (Signed)
 Please review.  KP

## 2024-01-14 ENCOUNTER — Ambulatory Visit: Admitting: Physician Assistant
